# Patient Record
Sex: Female | Born: 1935
Health system: Southern US, Community
[De-identification: ages and names within clinical notes are randomized; demographics above are authoritative.]

## PROBLEM LIST (undated history)

## (undated) DIAGNOSIS — E785 Hyperlipidemia, unspecified: Secondary | ICD-10-CM

## (undated) DIAGNOSIS — I10 Essential (primary) hypertension: Secondary | ICD-10-CM

## (undated) DIAGNOSIS — E079 Disorder of thyroid, unspecified: Secondary | ICD-10-CM

## (undated) HISTORY — PX: OTHER SURGICAL HISTORY: SHX169

---

## 2002-04-09 ENCOUNTER — Encounter: Payer: Self-pay | Admitting: Ophthalmology

## 2002-04-09 ENCOUNTER — Observation Stay (HOSPITAL_COMMUNITY): Admission: RE | Admit: 2002-04-09 | Discharge: 2002-04-10 | Payer: Self-pay | Admitting: Ophthalmology

## 2002-04-22 ENCOUNTER — Ambulatory Visit (HOSPITAL_COMMUNITY): Admission: RE | Admit: 2002-04-22 | Discharge: 2002-04-23 | Payer: Self-pay | Admitting: Ophthalmology

## 2019-04-08 DIAGNOSIS — Z1211 Encounter for screening for malignant neoplasm of colon: Secondary | ICD-10-CM | POA: Diagnosis not present

## 2019-04-08 DIAGNOSIS — Z7189 Other specified counseling: Secondary | ICD-10-CM | POA: Diagnosis not present

## 2019-04-08 DIAGNOSIS — Z1339 Encounter for screening examination for other mental health and behavioral disorders: Secondary | ICD-10-CM | POA: Diagnosis not present

## 2019-04-08 DIAGNOSIS — I10 Essential (primary) hypertension: Secondary | ICD-10-CM | POA: Diagnosis not present

## 2019-04-08 DIAGNOSIS — R5383 Other fatigue: Secondary | ICD-10-CM | POA: Diagnosis not present

## 2019-04-08 DIAGNOSIS — Z299 Encounter for prophylactic measures, unspecified: Secondary | ICD-10-CM | POA: Diagnosis not present

## 2019-04-08 DIAGNOSIS — Z681 Body mass index (BMI) 19 or less, adult: Secondary | ICD-10-CM | POA: Diagnosis not present

## 2019-04-08 DIAGNOSIS — Z1331 Encounter for screening for depression: Secondary | ICD-10-CM | POA: Diagnosis not present

## 2019-04-08 DIAGNOSIS — E559 Vitamin D deficiency, unspecified: Secondary | ICD-10-CM | POA: Diagnosis not present

## 2019-04-08 DIAGNOSIS — E7800 Pure hypercholesterolemia, unspecified: Secondary | ICD-10-CM | POA: Diagnosis not present

## 2019-04-08 DIAGNOSIS — Z0000 Encounter for general adult medical examination without abnormal findings: Secondary | ICD-10-CM | POA: Diagnosis not present

## 2019-04-08 DIAGNOSIS — E039 Hypothyroidism, unspecified: Secondary | ICD-10-CM | POA: Diagnosis not present

## 2019-04-17 ENCOUNTER — Ambulatory Visit: Payer: Self-pay | Attending: Internal Medicine

## 2019-04-17 DIAGNOSIS — Z23 Encounter for immunization: Secondary | ICD-10-CM

## 2019-04-17 NOTE — Progress Notes (Signed)
   Covid-19 Vaccination Clinic  Name:  Jacqueline Yates    MRN: [id number] DOB: [birth year]  04/17/2019  [name] was observed post Covid-19 immunization for 15 minutes without incident. She was provided with Vaccine Information Sheet and instruction to access the V-Safe system.   [name] was instructed to call 911 with any severe reactions post vaccine: Marland Kitchen Difficulty breathing  . Swelling of face and throat  . A fast heartbeat  . A bad rash all over body  . Dizziness and weakness   Immunizations Administered    Name Date Dose VIS Date Route   Moderna COVID-19 Vaccine 04/17/2019 10:18 AM 0.5 mL 12/11/2018 Intramuscular   Manufacturer: Gala Murdoch   Lot: [id number]   NDC: 30123-799-09

## 2019-05-10 DIAGNOSIS — I10 Essential (primary) hypertension: Secondary | ICD-10-CM | POA: Diagnosis not present

## 2019-05-20 ENCOUNTER — Other Ambulatory Visit: Payer: Self-pay

## 2019-05-20 ENCOUNTER — Emergency Department (HOSPITAL_COMMUNITY)
Admission: EM | Admit: 2019-05-20 | Discharge: 2019-05-20 | Disposition: A | Discharge status: Home / Self Care | Payer: Medicare HMO | Attending: Emergency Medicine | Admitting: Emergency Medicine

## 2019-05-20 ENCOUNTER — Encounter (HOSPITAL_COMMUNITY): Payer: Self-pay

## 2019-05-20 DIAGNOSIS — R531 Weakness: Secondary | ICD-10-CM | POA: Diagnosis not present

## 2019-05-20 DIAGNOSIS — R197 Diarrhea, unspecified: Secondary | ICD-10-CM | POA: Diagnosis not present

## 2019-05-20 DIAGNOSIS — R110 Nausea: Secondary | ICD-10-CM | POA: Insufficient documentation

## 2019-05-20 DIAGNOSIS — I10 Essential (primary) hypertension: Secondary | ICD-10-CM | POA: Diagnosis not present

## 2019-05-20 DIAGNOSIS — R42 Dizziness and giddiness: Secondary | ICD-10-CM | POA: Diagnosis not present

## 2019-05-20 DIAGNOSIS — E860 Dehydration: Secondary | ICD-10-CM | POA: Insufficient documentation

## 2019-05-20 HISTORY — DX: Hyperlipidemia, unspecified: E78.5

## 2019-05-20 HISTORY — DX: Disorder of thyroid, unspecified: E07.9

## 2019-05-20 HISTORY — DX: Essential (primary) hypertension: I10

## 2019-05-20 LAB — COMPREHENSIVE METABOLIC PANEL
ALT: 12 U/L (ref 0–44)
AST: 22 U/L (ref 15–41)
Albumin: 3.8 g/dL (ref 3.5–5.0)
Alkaline Phosphatase: 81 U/L (ref 38–126)
Anion gap: 12 (ref 5–15)
BUN: 25 mg/dL — ABNORMAL HIGH (ref 8–23)
CO2: 27 mmol/L (ref 22–32)
Calcium: 9.5 mg/dL (ref 8.9–10.3)
Chloride: 99 mmol/L (ref 98–111)
Creatinine, Ser: 1.09 mg/dL — ABNORMAL HIGH (ref 0.44–1.00)
GFR calc Af Amer: 53 mL/min — ABNORMAL LOW (ref >60)
GFR calc non Af Amer: 46 mL/min — ABNORMAL LOW (ref >60)
Glucose, Bld: 89 mg/dL (ref 70–99)
Potassium: 4.1 mmol/L (ref 3.5–5.1)
Sodium: 138 mmol/L (ref 135–145)
Total Bilirubin: 1.3 mg/dL — ABNORMAL HIGH (ref 0.3–1.2)
Total Protein: 7.1 g/dL (ref 6.5–8.1)

## 2019-05-20 LAB — CBC
HCT: 41.8 % (ref 36.0–46.0)
Hemoglobin: 13.4 g/dL (ref 12.0–15.0)
MCH: 31.2 pg (ref 26.0–34.0)
MCHC: 32.1 g/dL (ref 30.0–36.0)
MCV: 97.2 fL (ref 80.0–100.0)
Platelets: 195 10*3/uL (ref 150–400)
RBC: 4.3 MIL/uL (ref 3.87–5.11)
RDW: 13.5 % (ref 11.5–15.5)
WBC: 4.8 10*3/uL (ref 4.0–10.5)
nRBC: 0 % (ref 0.0–0.2)

## 2019-05-20 LAB — URINALYSIS, ROUTINE W REFLEX MICROSCOPIC
Bacteria, UA: NONE SEEN
Bilirubin Urine: NEGATIVE
Glucose, UA: NEGATIVE mg/dL
Ketones, ur: 5 mg/dL — AB
Leukocytes,Ua: NEGATIVE
Nitrite: NEGATIVE
Protein, ur: NEGATIVE mg/dL
Specific Gravity, Urine: 1.013 (ref 1.005–1.030)
pH: 5 (ref 5.0–8.0)

## 2019-05-20 MED ORDER — SODIUM CHLORIDE 0.9 % IV BOLUS
1000.0000 mL | Freq: Once | INTRAVENOUS | Status: AC
  Administered 2019-05-20: 1000 mL via INTRAVENOUS

## 2019-05-20 NOTE — ED Notes (Signed)
Spoke to [place], informed of pt discharge.

## 2019-05-20 NOTE — ED Provider Notes (Addendum)
[facility] EMERGENCY DEPARTMENT Provider Note   CSN: [id number] Arrival date & time: 05/20/19  0901     History Chief Complaint  Patient presents with  . Nausea    Jacqueline Yates is a 86 y.o. female.  Patient with generalized weakness, nausea, no appetite, poor po intake, lightheaded when stands, and couple episodes nvd in past day. Emesis clear, not blood or bilious. Diarrhea loose to watery, small amount. No abd pain or distension. No known bad food ingestion or ill contacts. No fever or chills. Lightheaded/faint when stands, no syncope. No room spinning or vertigo. No headache. No chest pain or discomfort. No sob. No cough or uri symptoms. No dysuria or gu c/o. No recent change in meds or new meds.   The history is provided by the patient.       Past Medical History:  Diagnosis Date  . Hyperlipemia   . Hypertension   . Thyroid disease     There are no problems to display for this patient.   Past Surgical History:  Procedure Laterality Date  . hip sx  left due to fall 0ct 2020       OB History   No obstetric history on file.     No family history on file.  Social History   Tobacco Use  . Smoking status: Never Smoker  Substance Use Topics  . Alcohol use: Not Currently  . Drug use: Not Currently    Home Medications Prior to Admission medications   Not on File    Allergies    Patient has no known allergies.  Review of Systems   Review of Systems  Constitutional: Negative for chills and fever.  HENT: Negative for sore throat.   Eyes: Negative for redness.  Respiratory: Negative for cough and shortness of breath.   Cardiovascular: Negative for chest pain, palpitations and leg swelling.  Gastrointestinal: Positive for diarrhea, nausea and vomiting. Negative for abdominal pain.  Endocrine: Negative for polyuria.  Genitourinary: Negative for dysuria and flank pain.  Musculoskeletal: Negative for back pain and neck pain.  Skin: Negative for rash.    Neurological: Negative for speech difficulty, numbness and headaches.  Hematological: Does not bruise/bleed easily.  Psychiatric/Behavioral: Negative for confusion.    Physical Exam Updated Vital Signs BP (!) 205/68 (BP Location: Left Arm)   Pulse 79   Temp 97.9 F (36.6 C) (Oral)   Resp 18   Ht 1.626 m (5\' 4" )   Wt 54.4 kg   SpO2 98%   BMI 20.60 kg/m   Physical Exam Vitals and nursing note reviewed.  Constitutional:      Appearance: Normal appearance. She is well-developed.  HENT:     Head: Atraumatic.     Nose: Nose normal.     Mouth/Throat:     Mouth: Mucous membranes are moist.  Eyes:     General: No scleral icterus.    Conjunctiva/sclera: Conjunctivae normal.  Neck:     Vascular: No carotid bruit.     Trachea: No tracheal deviation.     Comments: Thyroid not grossly enlarged or tender. No neck mass or swelling.  Cardiovascular:     Rate and Rhythm: Normal rate and regular rhythm.     Pulses: Normal pulses.     Heart sounds: Normal heart sounds. No murmur. No friction rub. No gallop.   Pulmonary:     Effort: Pulmonary effort is normal. No respiratory distress.     Breath sounds: Normal breath sounds.  Abdominal:  General: Bowel sounds are normal. There is no distension.     Palpations: Abdomen is soft.     Tenderness: There is no abdominal tenderness. There is no guarding.  Genitourinary:    Comments: No cva tenderness.  Musculoskeletal:        General: No swelling or tenderness.     Cervical back: Normal range of motion and neck supple. No rigidity. No muscular tenderness.  Skin:    General: Skin is warm and dry.     Findings: No rash.  Neurological:     Mental Status: She is alert.     Comments: Alert, speech normal/fluent. Motor intact bil, stre 5/5. sens grossly intact bil.   Psychiatric:        Mood and Affect: Mood normal.     ED Results / Procedures / Treatments   Labs (all labs ordered are listed, but only abnormal results are  displayed) Results for orders placed or performed during the hospital encounter of 05/20/19  CBC  Result Value Ref Range   WBC 4.8 4.0 - 10.5 K/uL   RBC 4.30 3.87 - 5.11 MIL/uL   Hemoglobin 13.4 12.0 - 15.0 g/dL   HCT 41.8 36.0 - 46.0 %   MCV 97.2 80.0 - 100.0 fL   MCH 31.2 26.0 - 34.0 pg   MCHC 32.1 30.0 - 36.0 g/dL   RDW 13.5 11.5 - 15.5 %   Platelets 195 150 - 400 K/uL   nRBC 0.0 0.0 - 0.2 %  Comprehensive metabolic panel  Result Value Ref Range   Sodium 138 135 - 145 mmol/L   Potassium 4.1 3.5 - 5.1 mmol/L   Chloride 99 98 - 111 mmol/L   CO2 27 22 - 32 mmol/L   Glucose, Bld 89 70 - 99 mg/dL   BUN 25 (H) 8 - 23 mg/dL   Creatinine, Ser 1.09 (H) 0.44 - 1.00 mg/dL   Calcium 9.5 8.9 - 10.3 mg/dL   Total Protein 7.1 6.5 - 8.1 g/dL   Albumin 3.8 3.5 - 5.0 g/dL   AST 22 15 - 41 U/L   ALT 12 0 - 44 U/L   Alkaline Phosphatase 81 38 - 126 U/L   Total Bilirubin 1.3 (H) 0.3 - 1.2 mg/dL   GFR calc non Af Amer 46 (L) >60 mL/min   GFR calc Af Amer 53 (L) >60 mL/min   Anion gap 12 5 - 15  Urinalysis, Routine w reflex microscopic  Result Value Ref Range   Color, Urine YELLOW YELLOW   APPearance CLEAR CLEAR   Specific Gravity, Urine 1.013 1.005 - 1.030   pH 5.0 5.0 - 8.0   Glucose, UA NEGATIVE NEGATIVE mg/dL   Hgb urine dipstick MODERATE (A) NEGATIVE   Bilirubin Urine NEGATIVE NEGATIVE   Ketones, ur 5 (A) NEGATIVE mg/dL   Protein, ur NEGATIVE NEGATIVE mg/dL   Nitrite NEGATIVE NEGATIVE   Leukocytes,Ua NEGATIVE NEGATIVE   RBC / HPF 0-5 0 - 5 RBC/hpf   WBC, UA 0-5 0 - 5 WBC/hpf   Bacteria, UA NONE SEEN NONE SEEN   Squamous Epithelial / LPF 0-5 0 - 5    EKG None  Radiology No results found.  Procedures Procedures (including critical care time)  Medications Ordered in ED Medications  sodium chloride 0.9 % bolus 1,000 mL (has no administration in time range)    ED Course  I have reviewed the triage vital signs and the nursing notes.  Pertinent labs & imaging results  that were available  during my care of the patient were reviewed by me and considered in my medical decision making (see chart for details).    MDM Rules/Calculators/A&P                      Iv ns bolus. Labs. Ecg.   Reviewed nursing notes and prior charts for additional history.  Initial labs reviewed/interpreted by me - chem normal. Bun is sl elev and trace ketone on UA - ?dehydration. hgb normal.   Po fluids, food.   Additional labs reviewed/interpreted by me - ua neg for infection.   Recheck pt, alert, content, no recurrent nv. No faintness. Tolerating po. Ambulated in hall.   Pt feels improved. No pain. No sob. abd soft nt. Tolerating po. No faintness or dizziness.   Pt currently appears stable for d/c.   Rec pcp f/u.  Return precautions provided.      Final Clinical Impression(s) / ED Diagnoses Final diagnoses:  None    Rx / DC Orders ED Discharge Orders    None         Cathren Laine, MD 05/20/19 [phone]

## 2019-05-20 NOTE — ED Notes (Signed)
Pt ambulated in hall via NT, tolerated well  Gait steady.

## 2019-05-20 NOTE — ED Triage Notes (Signed)
Pt reports nausea since yesterday morning. Diarrhea yesterday with only 2 loose stools. Last diarrhea last night. No vomiting. Denies pain. Has not taken BP med yesterday or today

## 2019-05-20 NOTE — ED Notes (Addendum)
Given fluids for po challenge.   

## 2019-05-20 NOTE — ED Notes (Signed)
Spoke to [employer] at [phone].  Family is asking if pt is going to be admitted.  Informed that the EDP or nurse will call if pt is admitted.

## 2019-05-20 NOTE — Discharge Instructions (Addendum)
It was our pleasure to provide your ER care today - we hope that you feel better.  Rest. Drink plenty of fluids. Eat balanced diet. Consider supplementing nutrition with Boost, Ensure, or other nutritious shake.   Follow up with your doctor in the next 1-2 days for recheck if symptoms fail to improve/resolve. Also follow up with your doctor for recheck of blood pressure, as it is high today.   Return to ER if worse, new symptoms, fevers, new or severe pain, abdominal pain, chest pain, trouble breathing, persistent vomiting, weak/fainting, or other concern.

## 2019-05-20 NOTE — ED Notes (Signed)
Pt aware urine sample is needed 

## 2019-05-20 NOTE — ED Notes (Signed)
Pt ambulates well with assistance

## 2019-05-20 NOTE — ED Notes (Signed)
Pt is aware we need urine sample.  

## 2019-05-20 NOTE — ED Notes (Signed)
Spoke with Delray Alt (daughter).  Will inform of MD plans once orders placed.

## 2019-05-20 NOTE — ED Notes (Signed)
Drank water and soda, tolerated well.  No c/o nausea at this time.

## 2019-05-21 ENCOUNTER — Ambulatory Visit: Payer: Self-pay

## 2019-05-21 ENCOUNTER — Ambulatory Visit: Payer: Medicare HMO | Attending: Internal Medicine

## 2019-05-21 DIAGNOSIS — Z23 Encounter for immunization: Secondary | ICD-10-CM

## 2019-05-21 NOTE — Progress Notes (Signed)
   Covid-19 Vaccination Clinic  Name:  Jacqueline Yates    MRN: [id number] DOB: 1932-01-03  05/21/2019  Ms. Romberger was observed post Covid-19 immunization for 15 minutes without incident. She was provided with Vaccine Information Sheet and instruction to access the V-Safe system.   Ms. Embry was instructed to call 911 with any severe reactions post vaccine: Marland Kitchen Difficulty breathing  . Swelling of face and throat  . A fast heartbeat  . A bad rash all over body  . Dizziness and weakness   Immunizations Administered    Name Date Dose VIS Date Route   Moderna COVID-19 Vaccine 05/21/2019 10:08 AM 0.5 mL 12/2018 Intramuscular   Manufacturer: Moderna   Lot: [id number]   NDC: 37342-876-81

## 2019-06-09 DIAGNOSIS — I10 Essential (primary) hypertension: Secondary | ICD-10-CM | POA: Diagnosis not present

## 2019-06-12 DIAGNOSIS — E538 Deficiency of other specified B group vitamins: Secondary | ICD-10-CM | POA: Diagnosis not present

## 2019-06-12 DIAGNOSIS — I10 Essential (primary) hypertension: Secondary | ICD-10-CM | POA: Diagnosis not present

## 2019-06-12 DIAGNOSIS — I25119 Atherosclerotic heart disease of native coronary artery with unspecified angina pectoris: Secondary | ICD-10-CM | POA: Diagnosis not present

## 2019-06-12 DIAGNOSIS — I739 Peripheral vascular disease, unspecified: Secondary | ICD-10-CM | POA: Diagnosis not present

## 2019-06-12 DIAGNOSIS — Z299 Encounter for prophylactic measures, unspecified: Secondary | ICD-10-CM | POA: Diagnosis not present

## 2019-07-10 DIAGNOSIS — I10 Essential (primary) hypertension: Secondary | ICD-10-CM | POA: Diagnosis not present

## 2019-08-09 DIAGNOSIS — I10 Essential (primary) hypertension: Secondary | ICD-10-CM | POA: Diagnosis not present

## 2019-09-10 DIAGNOSIS — I10 Essential (primary) hypertension: Secondary | ICD-10-CM | POA: Diagnosis not present

## 2019-09-12 DIAGNOSIS — N183 Chronic kidney disease, stage 3 unspecified: Secondary | ICD-10-CM | POA: Diagnosis not present

## 2019-09-12 DIAGNOSIS — Z681 Body mass index (BMI) 19 or less, adult: Secondary | ICD-10-CM | POA: Diagnosis not present

## 2019-09-12 DIAGNOSIS — I739 Peripheral vascular disease, unspecified: Secondary | ICD-10-CM | POA: Diagnosis not present

## 2019-09-12 DIAGNOSIS — E039 Hypothyroidism, unspecified: Secondary | ICD-10-CM | POA: Diagnosis not present

## 2019-09-12 DIAGNOSIS — I10 Essential (primary) hypertension: Secondary | ICD-10-CM | POA: Diagnosis not present

## 2019-09-12 DIAGNOSIS — Z299 Encounter for prophylactic measures, unspecified: Secondary | ICD-10-CM | POA: Diagnosis not present

## 2019-09-12 DIAGNOSIS — E7800 Pure hypercholesterolemia, unspecified: Secondary | ICD-10-CM | POA: Diagnosis not present

## 2019-09-12 DIAGNOSIS — I25119 Atherosclerotic heart disease of native coronary artery with unspecified angina pectoris: Secondary | ICD-10-CM | POA: Diagnosis not present

## 2019-09-21 ENCOUNTER — Emergency Department (HOSPITAL_COMMUNITY): Payer: Medicare HMO

## 2019-09-21 ENCOUNTER — Encounter (HOSPITAL_COMMUNITY): Payer: Self-pay

## 2019-09-21 ENCOUNTER — Other Ambulatory Visit: Payer: Self-pay

## 2019-09-21 ENCOUNTER — Emergency Department (HOSPITAL_COMMUNITY)
Admission: EM | Admit: 2019-09-21 | Discharge: 2019-09-21 | Disposition: A | Discharge status: Home / Self Care | Payer: Medicare HMO | Attending: Emergency Medicine | Admitting: Emergency Medicine

## 2019-09-21 DIAGNOSIS — Y999 Unspecified external cause status: Secondary | ICD-10-CM | POA: Diagnosis not present

## 2019-09-21 DIAGNOSIS — Y939 Activity, unspecified: Secondary | ICD-10-CM | POA: Insufficient documentation

## 2019-09-21 DIAGNOSIS — W19XXXA Unspecified fall, initial encounter: Secondary | ICD-10-CM | POA: Diagnosis not present

## 2019-09-21 DIAGNOSIS — I10 Essential (primary) hypertension: Secondary | ICD-10-CM | POA: Insufficient documentation

## 2019-09-21 DIAGNOSIS — S42302A Unspecified fracture of shaft of humerus, left arm, initial encounter for closed fracture: Secondary | ICD-10-CM | POA: Diagnosis not present

## 2019-09-21 DIAGNOSIS — R609 Edema, unspecified: Secondary | ICD-10-CM | POA: Diagnosis not present

## 2019-09-21 DIAGNOSIS — S42202A Unspecified fracture of upper end of left humerus, initial encounter for closed fracture: Secondary | ICD-10-CM

## 2019-09-21 DIAGNOSIS — S42292A Other displaced fracture of upper end of left humerus, initial encounter for closed fracture: Secondary | ICD-10-CM | POA: Diagnosis not present

## 2019-09-21 DIAGNOSIS — Y92090 Kitchen in other non-institutional residence as the place of occurrence of the external cause: Secondary | ICD-10-CM | POA: Diagnosis not present

## 2019-09-21 DIAGNOSIS — M25512 Pain in left shoulder: Secondary | ICD-10-CM | POA: Insufficient documentation

## 2019-09-21 DIAGNOSIS — R030 Elevated blood-pressure reading, without diagnosis of hypertension: Secondary | ICD-10-CM

## 2019-09-21 DIAGNOSIS — Z79899 Other long term (current) drug therapy: Secondary | ICD-10-CM | POA: Diagnosis not present

## 2019-09-21 DIAGNOSIS — M25519 Pain in unspecified shoulder: Secondary | ICD-10-CM | POA: Diagnosis not present

## 2019-09-21 DIAGNOSIS — W010XXA Fall on same level from slipping, tripping and stumbling without subsequent striking against object, initial encounter: Secondary | ICD-10-CM

## 2019-09-21 DIAGNOSIS — R52 Pain, unspecified: Secondary | ICD-10-CM | POA: Diagnosis not present

## 2019-09-21 DIAGNOSIS — R0902 Hypoxemia: Secondary | ICD-10-CM | POA: Diagnosis not present

## 2019-09-21 MED ORDER — TRAMADOL HCL 50 MG PO TABS
50.0000 mg | ORAL_TABLET | Freq: Once | ORAL | Status: AC
  Administered 2019-09-21: 50 mg via ORAL
  Filled 2019-09-21: qty 1

## 2019-09-21 MED ORDER — TRAMADOL HCL 50 MG PO TABS: 50.0000 mg | ORAL_TABLET | Freq: Three times a day (TID) | ORAL | 0 refills | Status: DC | PRN

## 2019-09-21 MED ORDER — ACETAMINOPHEN 325 MG PO TABS
650.0000 mg | ORAL_TABLET | Freq: Once | ORAL | Status: AC
  Administered 2019-09-21: 650 mg via ORAL
  Filled 2019-09-21: qty 2

## 2019-09-21 NOTE — ED Provider Notes (Addendum)
[facility] EMERGENCY DEPARTMENT Provider Note   CSN: [id number] Arrival date & time: 09/21/19  6203     History Chief Complaint  Patient presents with  . Fall    Left shoulder    Jacqueline Yates is a 86 y.o. female.  Patient presents via EMS s/p trip and fall last pm. States was in kitchen, did not have walker w her, and tripped/fell onto left shoulder. C/o acute onset left shoulder pain, constant, dull, moderate, non radiating, worse w movement of shoulder. Denies loc. No headache. No anticoag use. Denies neck or back pain. No hip or leg pain. Pt denies other pain or injury. Skin intact. No arm numbness/weakness. Otherwise does not feel sick or ill. Denies other recent fall. Pt is right hand dominant.   The history is provided by the patient and the EMS personnel.  Fall Pertinent negatives include no chest pain, no abdominal pain, no headaches and no shortness of breath.       Past Medical History:  Diagnosis Date  . Hyperlipemia   . Hypertension   . Thyroid disease     There are no problems to display for this patient.   Past Surgical History:  Procedure Laterality Date  . hip sx  left due to fall 0ct 2020       OB History   No obstetric history on file.     No family history on file.  Social History   Tobacco Use  . Smoking status: Never Smoker  . Smokeless tobacco: Never Used  Substance Use Topics  . Alcohol use: Not Currently  . Drug use: Not Currently    Home Medications Prior to Admission medications   Medication Sig Start Date End Date Taking? Authorizing Provider  atenolol (TENORMIN) 25 MG tablet Take 12.5 mg by mouth daily.  05/10/19   [provider]  levothyroxine (SYNTHROID) 25 MCG tablet Take 25 mcg by mouth daily before breakfast.    [provider]  simvastatin (ZOCOR) 20 MG tablet Take 20 mg by mouth at bedtime. 05/06/19   [provider]  VITAMIN D PO Take 1 tablet by mouth daily at 6 (six) AM.    [provider]    Allergies    Patient has no known allergies.  Review of Systems   Review of Systems  Constitutional: Negative for fever.  HENT: Negative for nosebleeds.   Eyes: Negative for redness.  Respiratory: Negative for shortness of breath.   Cardiovascular: Negative for chest pain.  Gastrointestinal: Negative for abdominal pain, nausea and vomiting.  Genitourinary: Negative for flank pain.  Musculoskeletal: Negative for back pain and neck pain.  Skin: Negative for wound.  Neurological: Negative for weakness, numbness and headaches.  Hematological: Does not bruise/bleed easily.  Psychiatric/Behavioral: Negative for confusion.    Physical Exam Updated Vital Signs BP (!) 206/80   Pulse 70   Temp 98.1 F (36.7 C) (Oral)   Resp 18   Ht 1.626 m (5\' 4" )   Wt 55 kg   SpO2 95%   BMI 20.81 kg/m   Physical Exam Vitals and nursing note reviewed.  Constitutional:      Appearance: Normal appearance. She is well-developed.  HENT:     Head: Atraumatic.     Nose: Nose normal.     Mouth/Throat:     Mouth: Mucous membranes are moist.  Eyes:     General: No scleral icterus.    Conjunctiva/sclera: Conjunctivae normal.     Pupils: Pupils are  equal, round, and reactive to light.  Neck:     Trachea: No tracheal deviation.  Cardiovascular:     Rate and Rhythm: Normal rate and regular rhythm.     Pulses: Normal pulses.     Heart sounds: Normal heart sounds. No murmur heard.  No friction rub. No gallop.   Pulmonary:     Effort: Pulmonary effort is normal. No respiratory distress.     Breath sounds: Normal breath sounds.  Chest:     Chest wall: No tenderness.  Abdominal:     General: Bowel sounds are normal. There is no distension.     Palpations: Abdomen is soft.     Tenderness: There is no abdominal tenderness.     Comments: No abd bruising or contusion.   Genitourinary:    Comments: No cva tenderness.  Musculoskeletal:        General: No swelling.     Cervical  back: Normal range of motion and neck supple. No rigidity. No muscular tenderness.     Comments: CTLS spine, non tender, aligned, no step off. Tenderness/pain left shoulder, mild sts. Radial pulse 2+ bil. Otherwise good rom bil extremities without other pain or focal bony tenderness.   Skin:    General: Skin is warm and dry.     Findings: No rash.  Neurological:     Mental Status: She is alert.     Comments: Alert, speech normal. GCS 15. LUE rad/med/uln fxn, motor and sensory intact. Moves bil ext with good strength. Sensation intact.   Psychiatric:        Mood and Affect: Mood normal.     ED Results / Procedures / Treatments   Labs (all labs ordered are listed, but only abnormal results are displayed) Labs Reviewed - No data to display  EKG None  Radiology DG Shoulder Left  Result Date: 09/21/2019 CLINICAL DATA:  Fall.  Shoulder pain EXAM: LEFT SHOULDER - 2+ VIEW COMPARISON:  None. FINDINGS: Severe impaction type fracture of the proximal LEFT humerus. The glenohumeral head is slightly subluxed in the joint. Avulsion of the greater tuberosity. IMPRESSION: Complex impaction fracture of the proximal LEFT humerus. Humeral head is subluxed inferiorly in the joint. Electronically Signed   By: Genevive Bi M.D.   On: 09/21/2019 07:27    Procedures Procedures (including critical care time)  Medications Ordered in ED Medications  acetaminophen (TYLENOL) tablet 650 mg (has no administration in time range)  traMADol (ULTRAM) tablet 50 mg (has no administration in time range)    ED Course  I have reviewed the triage vital signs and the nursing notes.  Pertinent labs & imaging results that were available during my care of the patient were reviewed by me and considered in my medical decision making (see chart for details).    MDM Rules/Calculators/A&P                          Xrays. Icepack to sore area.   Reviewed nursing notes and prior charts for additional history.    Xrays reviewed/interpreted by me - proximal humerus fx. Discussed xrays w pt.   Shoulder sling/immobilizer.   No meds pta. Acetaminophen po. Ultram po.  Pt indicates lives at home with daughter. Will also make home health referral.   Rec ortho f/u.  Return precautions provided.    Final Clinical Impression(s) / ED Diagnoses Final diagnoses:  None    Rx / DC Orders ED Discharge Orders  None        Cathren Laine, MD 09/21/19 [phone]

## 2019-09-21 NOTE — Discharge Instructions (Addendum)
It was our pleasure to provide your ER care today - we hope that you feel better.  Wear shoulder sling for comfort/support. Icepack/cold to sore area. Take acetaminophen as need for pain. You may also take ultram as need for pain. Fall precautions - use great care to avoid falls, consider having family get you a wheelchair to assist with getting around while your arm heals.   Follow up with orthopedist in the coming week - call office Monday AM to arrange appointment.   For blood pressure is high - continue your medication, limit salt intake, and follow up with primary care doctor in the next couple weeks.   We have made a home health referral - they should be contacting you in the next couple days.   Return to ER if worse, new symptoms, fevers, weak/fainting, new or severe pain, severe headache, numbness/weakness, or other concern.

## 2019-09-21 NOTE — ED Triage Notes (Addendum)
Pt brought in By EMS for a fall that occurred yesterday. Pt c/o left shoulder pain. Pt denies LOC. No other complaints at this time.

## 2019-09-26 DIAGNOSIS — Z299 Encounter for prophylactic measures, unspecified: Secondary | ICD-10-CM | POA: Diagnosis not present

## 2019-09-26 DIAGNOSIS — I739 Peripheral vascular disease, unspecified: Secondary | ICD-10-CM | POA: Diagnosis not present

## 2019-09-26 DIAGNOSIS — Z681 Body mass index (BMI) 19 or less, adult: Secondary | ICD-10-CM | POA: Diagnosis not present

## 2019-09-26 DIAGNOSIS — I10 Essential (primary) hypertension: Secondary | ICD-10-CM | POA: Diagnosis not present

## 2019-09-26 DIAGNOSIS — N183 Chronic kidney disease, stage 3 unspecified: Secondary | ICD-10-CM | POA: Diagnosis not present

## 2019-09-26 DIAGNOSIS — I25119 Atherosclerotic heart disease of native coronary artery with unspecified angina pectoris: Secondary | ICD-10-CM | POA: Diagnosis not present

## 2019-10-03 ENCOUNTER — Encounter: Payer: Self-pay | Admitting: Orthopaedic Surgery

## 2019-10-03 ENCOUNTER — Ambulatory Visit (INDEPENDENT_AMBULATORY_CARE_PROVIDER_SITE_OTHER): Payer: Medicare HMO | Admitting: Orthopaedic Surgery

## 2019-10-03 DIAGNOSIS — S42202A Unspecified fracture of upper end of left humerus, initial encounter for closed fracture: Secondary | ICD-10-CM | POA: Diagnosis not present

## 2019-10-03 NOTE — Progress Notes (Signed)
Office Visit Note   Patient: Jacqueline Yates           Date of Birth: [birth year]           MRN: [id number] Visit Date: 10/03/2019              Requested by: Medicine, [facility] [address] [place],  [place] [address] PCP: Medicine, [facility]   Assessment & Plan: Visit Diagnoses:  1. Closed fracture of proximal end of left humerus, unspecified fracture morphology, initial encounter     Plan: Three-part fracture with some inferior subluxation we discussed with her conservative treatment is initially recommended. She understands she might require hemiarthroplasty at a later point. She states pain is significantly decreased since the date of her fall. I'll check her back in 4 weeks with repeat x-ray she can work on gently moving her shoulder some with the sling on.  Follow-Up Instructions: Return in about 4 years (around 10/03/2023).   Orders:  No orders of the defined types were placed in this encounter.  No orders of the defined types were placed in this encounter.     Procedures: No procedures performed   Clinical Data: No additional findings.   Subjective: Chief Complaint  Patient presents with  . Left Shoulder - Fracture    Fall 09/20/2019    HPI 86 year old female fell 09/20/2019 and injuring her left nondominant shoulder. She had a comminuted fracture proximal humerus with some inferior subluxation of the head. She is in a shoulder immobilizer. States she was washing dishes and was going to the refrigerator when she fell. Patient is blind and right eye she had glaucoma and eyelids closed. She ambulates with a rolling walker just using the right hand only. She did have a fall in October broke her left hip and leg that surgery was done in [place]. Additional problems with hypertension osteoporosis and thyroid condition.  Review of Systems all other systems are noncontributory. She had multiple right eye surgeries.   Objective: Vital Signs: Ht 4\' 11"  (1.499 m)    Wt 96 lb (43.5 kg)   BMI 19.39 kg/m   Physical Exam Constitutional:      Appearance: She is well-developed.  HENT:     Head: Normocephalic.     Right Ear: External ear normal.     Left Ear: External ear normal.  Eyes:     Comments: Right eye closed she only cc items. Left eye reacts to light .   Neck:     Thyroid: No thyromegaly.     Trachea: No tracheal deviation.  Cardiovascular:     Rate and Rhythm: Normal rate.  Pulmonary:     Effort: Pulmonary effort is normal.  Abdominal:     Palpations: Abdomen is soft.  Skin:    General: Skin is warm and dry.  Neurological:     Mental Status: She is alert and oriented to person, place, and time.  Psychiatric:        Behavior: Behavior normal.     Ortho Exam patient's in a shoulder immobilizer. Strep runway spurs removed. She has deltoid intact sensation. Minimal swelling of the fingertips she can flex and extend her fingers.  Specialty Comments:  No specialty comments available.  Imaging: CLINICAL DATA:  Fall.  Shoulder pain  EXAM: LEFT SHOULDER - 2+ VIEW  COMPARISON:  None.  FINDINGS: Severe impaction type fracture of the proximal LEFT humerus. The glenohumeral head is slightly subluxed in the joint. Avulsion of the  greater tuberosity.  IMPRESSION: Complex impaction fracture of the proximal LEFT humerus.  Humeral head is subluxed inferiorly in the joint.   Electronically Signed   By: Genevive Bi M.D.   On: 09/21/2019 07:27    PMFS History: Patient Active Problem List   Diagnosis Date Noted  . Closed fracture of left proximal humerus 10/03/2019   Past Medical History:  Diagnosis Date  . Hyperlipemia   . Hypertension   . Thyroid disease     No family history on file.  Past Surgical History:  Procedure Laterality Date  . hip sx  left due to fall 0ct 2020     Social History   Occupational History  . Not on file  Tobacco Use  . Smoking status: Never Smoker  . Smokeless tobacco:  Never Used  Substance and Sexual Activity  . Alcohol use: Not Currently  . Drug use: Not Currently  . Sexual activity: Not on file

## 2019-10-10 DIAGNOSIS — I10 Essential (primary) hypertension: Secondary | ICD-10-CM | POA: Diagnosis not present

## 2019-11-07 ENCOUNTER — Ambulatory Visit (INDEPENDENT_AMBULATORY_CARE_PROVIDER_SITE_OTHER): Payer: Medicare HMO

## 2019-11-07 ENCOUNTER — Ambulatory Visit (INDEPENDENT_AMBULATORY_CARE_PROVIDER_SITE_OTHER): Payer: Medicare HMO | Admitting: Orthopaedic Surgery

## 2019-11-07 ENCOUNTER — Encounter: Payer: Self-pay | Admitting: Orthopaedic Surgery

## 2019-11-07 ENCOUNTER — Other Ambulatory Visit: Payer: Self-pay

## 2019-11-07 VITALS — Ht 59.0 in | Wt 96.0 lb

## 2019-11-07 DIAGNOSIS — S42202A Unspecified fracture of upper end of left humerus, initial encounter for closed fracture: Secondary | ICD-10-CM

## 2019-11-07 NOTE — Progress Notes (Signed)
Office Visit Note   Patient: Jacqueline Yates           Date of Birth: 04-01-1931           MRN: [id number] Visit Date: 11/07/2019              Requested by: Medicine, [facility] [address] [place],  [place] [address] PCP: Medicine, [facility]   Assessment & Plan: Visit Diagnoses:  1. Closed fracture of proximal end of left humerus, unspecified fracture morphology, initial encounter     Plan: Proximal humerus fracture healed with conservative treatment.  We will release her from care she can follow-up as needed.  X-ray results reviewed with patient.  Discontinue sling.  Follow-Up Instructions: No follow-ups on file.   Orders:  Orders Placed This Encounter  Procedures  . XR Shoulder Left   No orders of the defined types were placed in this encounter.     Procedures: No procedures performed   Clinical Data: No additional findings.   Subjective: Chief Complaint  Patient presents with  . Left Upper Arm - Fracture, Follow-up    Fall 09/21/2019    HPI 86 year old female returns post fall with proximal left humerus fracture she is wearing her sling up around her upper arm not at the elbow.  She is able to reach top of her head and across her opposite shoulder states she has no pain.  Sensation hand is intact.  Review of Systems updated unchanged from last visit.   Objective: Vital Signs: Ht 4\' 11"  (1.499 m)   Wt 96 lb (43.5 kg)   BMI 19.39 kg/m   Physical Exam Constitutional:      Appearance: She is well-developed.  HENT:     Head: Normocephalic.     Right Ear: External ear normal.     Left Ear: External ear normal.  Eyes:     Pupils: Pupils are equal, round, and reactive to light.  Neck:     Thyroid: No thyromegaly.     Trachea: No tracheal deviation.  Cardiovascular:     Rate and Rhythm: Normal rate.  Pulmonary:     Effort: Pulmonary effort is normal.  Abdominal:     Palpations: Abdomen is soft.  Skin:    General: Skin is warm and dry.    Neurological:     Mental Status: She is alert and oriented to person, place, and time.  Psychiatric:        Behavior: Behavior normal.     Ortho Exam patient is using a rolling walker she has no pain in proximal and distal portions of the humerus move in 1 piece.  She can reach the top of her head across to her opposite shoulder she is limited in hand to just posterior axillary line but states she is not having problems with activities of daily living.  Specialty Comments:  No specialty comments available.  Imaging: XR Shoulder Left  Result Date: 11/07/2019 Three-view x-rays left proximal humerus obtained and reviewed.  This shows healed surgical neck fracture with some angulation and shortening.  Humeral head is against the glenoid. Impression: Healed proximal humerus fracture without subluxation or dislocation of the left  shoulder.    PMFS History: Patient Active Problem List   Diagnosis Date Noted  . Closed fracture of left proximal humerus 10/03/2019   Past Medical History:  Diagnosis Date  . Hyperlipemia   . Hypertension   . Thyroid disease     No family history on  file.  Past Surgical History:  Procedure Laterality Date  . hip sx  left due to fall 0ct 2020     Social History   Occupational History  . Not on file  Tobacco Use  . Smoking status: Never Smoker  . Smokeless tobacco: Never Used  Substance and Sexual Activity  . Alcohol use: Not Currently  . Drug use: Not Currently  . Sexual activity: Not on file

## 2019-11-09 DIAGNOSIS — I10 Essential (primary) hypertension: Secondary | ICD-10-CM | POA: Diagnosis not present

## 2019-12-10 DIAGNOSIS — I10 Essential (primary) hypertension: Secondary | ICD-10-CM | POA: Diagnosis not present

## 2019-12-12 DIAGNOSIS — I10 Essential (primary) hypertension: Secondary | ICD-10-CM | POA: Diagnosis not present

## 2019-12-12 DIAGNOSIS — I25119 Atherosclerotic heart disease of native coronary artery with unspecified angina pectoris: Secondary | ICD-10-CM | POA: Diagnosis not present

## 2019-12-12 DIAGNOSIS — Z23 Encounter for immunization: Secondary | ICD-10-CM | POA: Diagnosis not present

## 2019-12-12 DIAGNOSIS — E7800 Pure hypercholesterolemia, unspecified: Secondary | ICD-10-CM | POA: Diagnosis not present

## 2019-12-12 DIAGNOSIS — Z299 Encounter for prophylactic measures, unspecified: Secondary | ICD-10-CM | POA: Diagnosis not present

## 2019-12-12 DIAGNOSIS — I739 Peripheral vascular disease, unspecified: Secondary | ICD-10-CM | POA: Diagnosis not present

## 2020-01-09 DIAGNOSIS — I10 Essential (primary) hypertension: Secondary | ICD-10-CM | POA: Diagnosis not present

## 2020-02-10 DIAGNOSIS — I10 Essential (primary) hypertension: Secondary | ICD-10-CM | POA: Diagnosis not present

## 2020-03-09 DIAGNOSIS — I10 Essential (primary) hypertension: Secondary | ICD-10-CM | POA: Diagnosis not present

## 2020-03-11 DIAGNOSIS — I739 Peripheral vascular disease, unspecified: Secondary | ICD-10-CM | POA: Diagnosis not present

## 2020-03-11 DIAGNOSIS — I10 Essential (primary) hypertension: Secondary | ICD-10-CM | POA: Diagnosis not present

## 2020-03-11 DIAGNOSIS — Z299 Encounter for prophylactic measures, unspecified: Secondary | ICD-10-CM | POA: Diagnosis not present

## 2020-03-11 DIAGNOSIS — I2510 Atherosclerotic heart disease of native coronary artery without angina pectoris: Secondary | ICD-10-CM | POA: Diagnosis not present

## 2020-03-11 DIAGNOSIS — N183 Chronic kidney disease, stage 3 unspecified: Secondary | ICD-10-CM | POA: Diagnosis not present

## 2020-03-11 DIAGNOSIS — E039 Hypothyroidism, unspecified: Secondary | ICD-10-CM | POA: Diagnosis not present

## 2020-03-11 DIAGNOSIS — I25119 Atherosclerotic heart disease of native coronary artery with unspecified angina pectoris: Secondary | ICD-10-CM | POA: Diagnosis not present

## 2020-04-08 DIAGNOSIS — Z7189 Other specified counseling: Secondary | ICD-10-CM | POA: Diagnosis not present

## 2020-04-08 DIAGNOSIS — Z1339 Encounter for screening examination for other mental health and behavioral disorders: Secondary | ICD-10-CM | POA: Diagnosis not present

## 2020-04-08 DIAGNOSIS — Z6821 Body mass index (BMI) 21.0-21.9, adult: Secondary | ICD-10-CM | POA: Diagnosis not present

## 2020-04-08 DIAGNOSIS — I10 Essential (primary) hypertension: Secondary | ICD-10-CM | POA: Diagnosis not present

## 2020-04-08 DIAGNOSIS — E7800 Pure hypercholesterolemia, unspecified: Secondary | ICD-10-CM | POA: Diagnosis not present

## 2020-04-08 DIAGNOSIS — Z299 Encounter for prophylactic measures, unspecified: Secondary | ICD-10-CM | POA: Diagnosis not present

## 2020-04-08 DIAGNOSIS — E039 Hypothyroidism, unspecified: Secondary | ICD-10-CM | POA: Diagnosis not present

## 2020-04-08 DIAGNOSIS — Z0000 Encounter for general adult medical examination without abnormal findings: Secondary | ICD-10-CM | POA: Diagnosis not present

## 2020-04-08 DIAGNOSIS — Z1331 Encounter for screening for depression: Secondary | ICD-10-CM | POA: Diagnosis not present

## 2020-04-09 DIAGNOSIS — I10 Essential (primary) hypertension: Secondary | ICD-10-CM | POA: Diagnosis not present

## 2020-05-08 DIAGNOSIS — I10 Essential (primary) hypertension: Secondary | ICD-10-CM | POA: Diagnosis not present

## 2020-06-09 DIAGNOSIS — I10 Essential (primary) hypertension: Secondary | ICD-10-CM | POA: Diagnosis not present

## 2020-07-09 DIAGNOSIS — I10 Essential (primary) hypertension: Secondary | ICD-10-CM | POA: Diagnosis not present

## 2020-08-24 ENCOUNTER — Other Ambulatory Visit (HOSPITAL_COMMUNITY): Payer: Medicare HMO

## 2020-08-24 ENCOUNTER — Emergency Department (HOSPITAL_COMMUNITY): Payer: Medicare HMO

## 2020-08-24 ENCOUNTER — Inpatient Hospital Stay (HOSPITAL_COMMUNITY)
Admission: EM | Admit: 2020-08-24 | Discharge: 2020-08-26 | DRG: 065 | Disposition: A | Discharge status: Home Health | Payer: Medicare HMO | Attending: Neurology | Admitting: Neurology

## 2020-08-24 ENCOUNTER — Inpatient Hospital Stay (HOSPITAL_COMMUNITY): Payer: Medicare HMO

## 2020-08-24 ENCOUNTER — Encounter (HOSPITAL_COMMUNITY): Payer: Self-pay

## 2020-08-24 DIAGNOSIS — I161 Hypertensive emergency: Secondary | ICD-10-CM

## 2020-08-24 DIAGNOSIS — I618 Other nontraumatic intracerebral hemorrhage: Secondary | ICD-10-CM | POA: Diagnosis not present

## 2020-08-24 DIAGNOSIS — I169 Hypertensive crisis, unspecified: Secondary | ICD-10-CM

## 2020-08-24 DIAGNOSIS — I619 Nontraumatic intracerebral hemorrhage, unspecified: Secondary | ICD-10-CM | POA: Diagnosis not present

## 2020-08-24 DIAGNOSIS — Z79899 Other long term (current) drug therapy: Secondary | ICD-10-CM

## 2020-08-24 DIAGNOSIS — I6389 Other cerebral infarction: Secondary | ICD-10-CM | POA: Diagnosis not present

## 2020-08-24 DIAGNOSIS — S01111A Laceration without foreign body of right eyelid and periocular area, initial encounter: Secondary | ICD-10-CM | POA: Diagnosis present

## 2020-08-24 DIAGNOSIS — I10 Essential (primary) hypertension: Secondary | ICD-10-CM | POA: Diagnosis not present

## 2020-08-24 DIAGNOSIS — Y92009 Unspecified place in unspecified non-institutional (private) residence as the place of occurrence of the external cause: Secondary | ICD-10-CM | POA: Diagnosis not present

## 2020-08-24 DIAGNOSIS — M50323 Other cervical disc degeneration at C6-C7 level: Secondary | ICD-10-CM | POA: Diagnosis not present

## 2020-08-24 DIAGNOSIS — R58 Hemorrhage, not elsewhere classified: Secondary | ICD-10-CM | POA: Diagnosis not present

## 2020-08-24 DIAGNOSIS — H5461 Unqualified visual loss, right eye, normal vision left eye: Secondary | ICD-10-CM | POA: Diagnosis present

## 2020-08-24 DIAGNOSIS — Z6820 Body mass index (BMI) 20.0-20.9, adult: Secondary | ICD-10-CM | POA: Diagnosis not present

## 2020-08-24 DIAGNOSIS — W1830XA Fall on same level, unspecified, initial encounter: Secondary | ICD-10-CM | POA: Diagnosis present

## 2020-08-24 DIAGNOSIS — I6200 Nontraumatic subdural hemorrhage, unspecified: Secondary | ICD-10-CM | POA: Diagnosis not present

## 2020-08-24 DIAGNOSIS — S199XXA Unspecified injury of neck, initial encounter: Secondary | ICD-10-CM | POA: Diagnosis not present

## 2020-08-24 DIAGNOSIS — Z9181 History of falling: Secondary | ICD-10-CM

## 2020-08-24 DIAGNOSIS — Z23 Encounter for immunization: Secondary | ICD-10-CM

## 2020-08-24 DIAGNOSIS — S0181XA Laceration without foreign body of other part of head, initial encounter: Secondary | ICD-10-CM

## 2020-08-24 DIAGNOSIS — Z96642 Presence of left artificial hip joint: Secondary | ICD-10-CM | POA: Diagnosis present

## 2020-08-24 DIAGNOSIS — M50322 Other cervical disc degeneration at C5-C6 level: Secondary | ICD-10-CM | POA: Diagnosis not present

## 2020-08-24 DIAGNOSIS — E441 Mild protein-calorie malnutrition: Secondary | ICD-10-CM | POA: Diagnosis present

## 2020-08-24 DIAGNOSIS — E039 Hypothyroidism, unspecified: Secondary | ICD-10-CM | POA: Diagnosis not present

## 2020-08-24 DIAGNOSIS — H409 Unspecified glaucoma: Secondary | ICD-10-CM | POA: Diagnosis present

## 2020-08-24 DIAGNOSIS — M79605 Pain in left leg: Secondary | ICD-10-CM | POA: Diagnosis not present

## 2020-08-24 DIAGNOSIS — I639 Cerebral infarction, unspecified: Secondary | ICD-10-CM | POA: Diagnosis not present

## 2020-08-24 DIAGNOSIS — E785 Hyperlipidemia, unspecified: Secondary | ICD-10-CM | POA: Diagnosis present

## 2020-08-24 DIAGNOSIS — Z20822 Contact with and (suspected) exposure to covid-19: Secondary | ICD-10-CM | POA: Diagnosis present

## 2020-08-24 DIAGNOSIS — M4312 Spondylolisthesis, cervical region: Secondary | ICD-10-CM | POA: Diagnosis not present

## 2020-08-24 DIAGNOSIS — S06340A Traumatic hemorrhage of right cerebrum without loss of consciousness, initial encounter: Secondary | ICD-10-CM | POA: Diagnosis not present

## 2020-08-24 DIAGNOSIS — S32512A Fracture of superior rim of left pubis, initial encounter for closed fracture: Secondary | ICD-10-CM | POA: Diagnosis not present

## 2020-08-24 DIAGNOSIS — Y92512 Supermarket, store or market as the place of occurrence of the external cause: Secondary | ICD-10-CM | POA: Diagnosis not present

## 2020-08-24 DIAGNOSIS — I160 Hypertensive urgency: Secondary | ICD-10-CM | POA: Diagnosis present

## 2020-08-24 DIAGNOSIS — I610 Nontraumatic intracerebral hemorrhage in hemisphere, subcortical: Secondary | ICD-10-CM | POA: Diagnosis not present

## 2020-08-24 DIAGNOSIS — W19XXXA Unspecified fall, initial encounter: Secondary | ICD-10-CM

## 2020-08-24 DIAGNOSIS — E119 Type 2 diabetes mellitus without complications: Secondary | ICD-10-CM | POA: Diagnosis present

## 2020-08-24 DIAGNOSIS — I609 Nontraumatic subarachnoid hemorrhage, unspecified: Secondary | ICD-10-CM | POA: Diagnosis present

## 2020-08-24 DIAGNOSIS — Z043 Encounter for examination and observation following other accident: Secondary | ICD-10-CM | POA: Diagnosis not present

## 2020-08-24 DIAGNOSIS — G319 Degenerative disease of nervous system, unspecified: Secondary | ICD-10-CM | POA: Diagnosis not present

## 2020-08-24 DIAGNOSIS — Z7984 Long term (current) use of oral hypoglycemic drugs: Secondary | ICD-10-CM

## 2020-08-24 DIAGNOSIS — Z9889 Other specified postprocedural states: Secondary | ICD-10-CM | POA: Diagnosis not present

## 2020-08-24 DIAGNOSIS — S0990XA Unspecified injury of head, initial encounter: Secondary | ICD-10-CM | POA: Diagnosis not present

## 2020-08-24 LAB — RESP PANEL BY RT-PCR (FLU A&B, COVID) ARPGX2
Influenza A by PCR: NEGATIVE
Influenza B by PCR: NEGATIVE
SARS Coronavirus 2 by RT PCR: NEGATIVE

## 2020-08-24 LAB — CBC WITH DIFFERENTIAL/PLATELET
Abs Immature Granulocytes: 0.03 10*3/uL (ref 0.00–0.07)
Basophils Absolute: 0.1 10*3/uL (ref 0.0–0.1)
Basophils Relative: 1 %
Eosinophils Absolute: 0.1 10*3/uL (ref 0.0–0.5)
Eosinophils Relative: 1 %
HCT: 41.8 % (ref 36.0–46.0)
Hemoglobin: 13.3 g/dL (ref 12.0–15.0)
Immature Granulocytes: 0 %
Lymphocytes Relative: 19 %
Lymphs Abs: 1.5 10*3/uL (ref 0.7–4.0)
MCH: 30 pg (ref 26.0–34.0)
MCHC: 31.8 g/dL (ref 30.0–36.0)
MCV: 94.4 fL (ref 80.0–100.0)
Monocytes Absolute: 0.6 10*3/uL (ref 0.1–1.0)
Monocytes Relative: 7 %
Neutro Abs: 5.8 10*3/uL (ref 1.7–7.7)
Neutrophils Relative %: 72 %
Platelets: 185 10*3/uL (ref 150–400)
RBC: 4.43 MIL/uL (ref 3.87–5.11)
RDW: 14.1 % (ref 11.5–15.5)
WBC: 8.1 10*3/uL (ref 4.0–10.5)
nRBC: 0 % (ref 0.0–0.2)

## 2020-08-24 LAB — BASIC METABOLIC PANEL
Anion gap: 8 (ref 5–15)
BUN: 20 mg/dL (ref 8–23)
CO2: 26 mmol/L (ref 22–32)
Calcium: 9.3 mg/dL (ref 8.9–10.3)
Chloride: 106 mmol/L (ref 98–111)
Creatinine, Ser: 1.1 mg/dL — ABNORMAL HIGH (ref 0.44–1.00)
GFR, Estimated: 49 mL/min — ABNORMAL LOW (ref >60)
Glucose, Bld: 99 mg/dL (ref 70–99)
Potassium: 3.9 mmol/L (ref 3.5–5.1)
Sodium: 140 mmol/L (ref 135–145)

## 2020-08-24 LAB — PROTIME-INR
INR: 1.1 (ref 0.8–1.2)
Prothrombin Time: 13.8 seconds (ref 11.4–15.2)

## 2020-08-24 LAB — LIPID PANEL
Cholesterol: 196 mg/dL (ref 0–200)
HDL: 61 mg/dL (ref >40)
LDL Cholesterol: 121 mg/dL — ABNORMAL HIGH (ref 0–99)
Total CHOL/HDL Ratio: 3.2 RATIO
Triglycerides: 69 mg/dL (ref <150)
VLDL: 14 mg/dL (ref 0–40)

## 2020-08-24 LAB — HEMOGLOBIN A1C
Hgb A1c MFr Bld: 5.5 % (ref 4.8–5.6)
Mean Plasma Glucose: 111.15 mg/dL

## 2020-08-24 LAB — APTT: aPTT: 26 seconds (ref 24–36)

## 2020-08-24 MED ORDER — PANTOPRAZOLE SODIUM 40 MG IV SOLR
40.0000 mg | Freq: Every day | INTRAVENOUS | Status: DC
  Administered 2020-08-24: 40 mg via INTRAVENOUS
  Filled 2020-08-24: qty 40

## 2020-08-24 MED ORDER — LABETALOL HCL 5 MG/ML IV SOLN: 10.0000 mg | Freq: Once | INTRAVENOUS | Status: DC

## 2020-08-24 MED ORDER — LIDOCAINE-EPINEPHRINE-TETRACAINE (LET) TOPICAL GEL
3.0000 mL | Freq: Once | TOPICAL | Status: AC
  Administered 2020-08-24: 3 mL via TOPICAL
  Filled 2020-08-24: qty 3

## 2020-08-24 MED ORDER — SIMVASTATIN 20 MG PO TABS
20.0000 mg | ORAL_TABLET | Freq: Every day | ORAL | Status: DC
  Administered 2020-08-24 – 2020-08-25 (×2): 20 mg via ORAL
  Filled 2020-08-24 (×2): qty 1

## 2020-08-24 MED ORDER — TETANUS-DIPHTH-ACELL PERTUSSIS 5-2.5-18.5 LF-MCG/0.5 IM SUSY
0.5000 mL | PREFILLED_SYRINGE | Freq: Once | INTRAMUSCULAR | Status: AC
  Administered 2020-08-24: 0.5 mL via INTRAMUSCULAR
  Filled 2020-08-24: qty 0.5

## 2020-08-24 MED ORDER — STROKE: EARLY STAGES OF RECOVERY BOOK
Freq: Once | Status: AC
  Filled 2020-08-24: qty 1

## 2020-08-24 MED ORDER — CLEVIDIPINE BUTYRATE 0.5 MG/ML IV EMUL
0.0000 mg/h | INTRAVENOUS | Status: DC
  Administered 2020-08-24: 1 mg/h via INTRAVENOUS
  Filled 2020-08-24 (×2): qty 50

## 2020-08-24 MED ORDER — ACETAMINOPHEN 160 MG/5ML PO SOLN: 650.0000 mg | ORAL | Status: DC | PRN

## 2020-08-24 MED ORDER — SENNOSIDES-DOCUSATE SODIUM 8.6-50 MG PO TABS
1.0000 | ORAL_TABLET | Freq: Two times a day (BID) | ORAL | Status: DC
  Administered 2020-08-24 – 2020-08-26 (×4): 1 via ORAL
  Filled 2020-08-24 (×4): qty 1

## 2020-08-24 MED ORDER — LEVOTHYROXINE SODIUM 25 MCG PO TABS
25.0000 ug | ORAL_TABLET | Freq: Every day | ORAL | Status: DC
  Administered 2020-08-25 – 2020-08-26 (×2): 25 ug via ORAL
  Filled 2020-08-24 (×2): qty 1

## 2020-08-24 MED ORDER — LIDOCAINE HCL (PF) 1 % IJ SOLN
5.0000 mL | Freq: Once | INTRAMUSCULAR | Status: AC
  Administered 2020-08-24: 5 mL

## 2020-08-24 MED ORDER — ACETAMINOPHEN 325 MG PO TABS: 650.0000 mg | ORAL_TABLET | ORAL | Status: DC | PRN

## 2020-08-24 MED ORDER — CHLORHEXIDINE GLUCONATE CLOTH 2 % EX PADS
6.0000 | MEDICATED_PAD | Freq: Every day | CUTANEOUS | Status: DC
  Administered 2020-08-25: 6 via TOPICAL

## 2020-08-24 MED ORDER — LIDOCAINE HCL (PF) 1 % IJ SOLN
INTRAMUSCULAR | Status: AC
  Filled 2020-08-24: qty 5

## 2020-08-24 MED ORDER — ACETAMINOPHEN 650 MG RE SUPP: 650.0000 mg | RECTAL | Status: DC | PRN

## 2020-08-24 NOTE — ED Notes (Signed)
Per Britni PA, OK to titrate to 1.5mg  of cleviprex. Pt BP dropped to 112 systolic w/ drip going @ 2mg  / hr. Pt titrated back down to 1 mg and pt elevated to 149systolic. Pt now running @ 1.5mg  / hr. Will continue to monitor

## 2020-08-24 NOTE — ED Triage Notes (Signed)
Pt. Arrived via EMS after falling at [employer]. Pt has a laceration above their left eye from where they fell. Pt. Is not on blood thinners. Bleeding is controlled at this time. CBG is 110. Pts. Blood pressure was 210/105 on scene. Pts. Blood pressure on arrival was 208/90.

## 2020-08-24 NOTE — ED Notes (Addendum)
Per Brittni, PA cleviprex goal <140 for systolic bp

## 2020-08-24 NOTE — H&P (Signed)
NEUROLOGY CONSULTATION NOTE   Date of service: August 24, 2020 Patient Name: Jacqueline Yates MRN:  [id number] DOB:  Jan 12, 1931 _ _ _   _ __   _ __ _ _  __ __   _ __   __ _  History of Present Illness  Jacqueline Yates is a 87 y.o. female with PMH significant for HLD, HTN, hypothyroidism, chronic R eye blindness 2/2 glaucoma, prior left humerus fracture and left hip replacement who presents with a fall at [employer] and hit her head with a L forehead laceration. She was initially taken to [facility] ED where she was found to have an acute small R BG ICH. She was noted to have elevated BP. She was started on Cleviprex and transferred to [facility] for further evaluation and workup.  She endorses chronic HTN which has been difficult to manage. Lives the daughter who check her blood pressure frequently and it varies all the time. Sometimes will be high, other times low. Takes her blood pressure meds on time.  No prior hx of strokes, no hx of ICH. Does have a hx of falls. Does not smoke.  mRS:3 tPA/Thrombectomy: No, ICH ICH score: 1 NIHSS components Score: Comment  1a Level of Conscious 0[x]  1[]  2[]  3[]      1b LOC Questions 0[x]  1[]  2[]       1c LOC Commands 0[x]  1[]  2[]       2 Best Gaze 0[x]  1[]  2[]       3 Visual 0[]  1[]  2[x]  3[]    Chronic R eye blindness  4 Facial Palsy 0[x]  1[]  2[]  3[]      5a Motor Arm - left 0[x]  1[]  2[]  3[]  4[]  UN[]    5b Motor Arm - Right 0[x]  1[]  2[]  3[]  4[]  UN[]    6a Motor Leg - Left 0[x]  1[]  2[]  3[]  4[]  UN[]    6b Motor Leg - Right 0[x]  1[]  2[]  3[]  4[]  UN[]    7 Limb Ataxia 0[x]  1[]  2[]  3[]  UN[]     8 Sensory 0[x]  1[]  2[]  UN[]      9 Best Language 0[x]  1[]  2[]  3[]      10 Dysarthria 0[x]  1[]  2[]  UN[]      11 Extinct. and Inattention 0[x]  1[]  2[]       TOTAL: 2     ROS   Constitutional Denies weight loss, fever and chills.   HEENT Chronic R eye vision loss 2/2 glaucoma. Hard of hearing.   Respiratory Denies SOB and cough.   CV Denies palpitations and CP   GI Denies  abdominal pain, nausea, vomiting and diarrhea.   GU Denies dysuria and urinary frequency.   MSK Denies myalgia and joint pain.   Skin Denies rash and pruritus.   Neurological Denies headache and syncope.   Psychiatric Denies recent changes in mood. Denies anxiety and depression.    Past History   Past Medical History:  Diagnosis Date  . Hyperlipemia   . Hypertension   . Thyroid disease    Past Surgical History:  Procedure Laterality Date  . hip sx  left due to fall 0ct 2020     History reviewed. No pertinent family history. Social History   Socioeconomic History  . Marital status: Widowed    Spouse name: Not on file  . Number of children: Not on file  . Years of education: Not on file  . Highest education level: Not on file  Occupational History  . Not on file  Tobacco Use  . Smoking status: Never  . Smokeless  tobacco: Never  Substance and Sexual Activity  . Alcohol use: Not Currently  . Drug use: Not Currently  . Sexual activity: Not on file  Other Topics Concern  . Not on file  Social History Narrative  . Not on file   Social Determinants of Health   Financial Resource Strain: Not on file  Food Insecurity: Not on file  Transportation Needs: Not on file  Physical Activity: Not on file  Stress: Not on file  Social Connections: Not on file   No Known Allergies  Medications   Medications Prior to Admission  Medication Sig Dispense Refill Last Dose  . atenolol (TENORMIN) 25 MG tablet Take 25 mg by mouth daily.   08/23/2020 at 1700  . levothyroxine (SYNTHROID) 25 MCG tablet Take 25 mcg by mouth daily before breakfast.   08/24/2020  . simvastatin (ZOCOR) 20 MG tablet Take 20 mg by mouth at bedtime.   08/23/2020  . traMADol (ULTRAM) 50 MG tablet Take 1 tablet (50 mg total) by mouth every 8 (eight) hours as needed. (Patient not taking: No sig reported) 15 tablet 0 Not Taking  . VITAMIN D PO Take 1 tablet by mouth daily at 6 (six) AM. (Patient not taking: No sig  reported)   Not Taking     Vitals   Vitals:   08/24/20 1715 08/24/20 1730 08/24/20 1800 08/24/20 1815  BP: (!) 123/53 (!) 132/55 (!) 126/50 (!) 130/54  Pulse: 68 73 76 73  Resp: 12 14 17 16   Temp:      TempSrc:      SpO2: 95% 95% 96% 97%  Weight:      Height:         Body mass index is 20 kg/m.  Physical Exam   General: Laying comfortably in bed; in no acute distress.  HENT: Normal oropharynx and mucosa. Normal external appearance of ears and nose.  Neck: Supple, no pain or tenderness  CV: No JVD. No peripheral edema.  Pulmonary: Symmetric Chest rise. Normal respiratory effort.  Abdomen: Soft to touch, non-tender.  Ext: No cyanosis, edema, or deformity  Skin: No rash. Normal palpation of skin.   Musculoskeletal: Normal digits and nails by inspection. No clubbing.   Neurologic Examination  Mental status/Cognition: Alert, oriented to self, place, month and year, good attention.  Speech/language: Fluent, comprehension intact, object naming intact, repetition intact.  Cranial nerves:   CN II Pupils equal and reactive to light, R eye blind.   CN III,IV,VI EOM intact, no gaze preference or deviation, no nystagmus    CN V normal sensation in V1, V2, and V3 segments bilaterally   CN VII no asymmetry, no nasolabial fold flattening   CN VIII normal hearing to speech   CN IX & X normal palatal elevation, no uvular deviation   CN XI 5/5 head turn and 5/5 shoulder shrug bilaterally   CN XII midline tongue protrusion   Motor:  Muscle bulk: poor with thin extremities, tone normal, pronator drift none tremor none Mvmt Root Nerve  Muscle Right Left Comments  SA C5/6 Ax Deltoid 4 3 Has chronic L humerus fx.  EF C5/6 Mc Biceps 4 4   EE C6/7/8 Rad Triceps 4 4   WF C6/7 Med FCR     WE C7/8 PIN ECU     F Ab C8/T1 U ADM/FDI 4+ 4+   HF L1/2/3 Fem Illopsoas     KE L2/3/4 Fem Quad     DF L4/5 D Peron Tib Ant  PF S1/2 Tibial Grc/Sol      Reflexes:  Right Left Comments  Pectoralis       Biceps (C5/6) 2 2   Brachioradialis (C5/6) 2 2    Triceps (C6/7) 2 2    Patellar (L3/4) 2 2    Achilles (S1)      Hoffman      Plantar     Jaw jerk    Sensation:  Light touch intact   Pin prick    Temperature    Vibration   Proprioception    Coordination/Complex Motor:  - Finger to Nose intat  - Heel to shin intact BL - Rapid alternating movement are slowed throughout - Gait: unsafe given her ICH and frequent falls.  Labs   CBC:  Recent Labs  Lab 08/24/20 1507  WBC 8.1  NEUTROABS 5.8  HGB 13.3  HCT 41.8  MCV 94.4  PLT 185    Basic Metabolic Panel:  Lab Results  Component Value Date   NA 140 08/24/2020   K 3.9 08/24/2020   CO2 26 08/24/2020   GLUCOSE 99 08/24/2020   BUN 20 08/24/2020   CREATININE 1.10 (H) 08/24/2020   CALCIUM 9.3 08/24/2020   GFRNONAA 49 (L) 08/24/2020   GFRAA 53 (L) 05/20/2019   Lipid Panel: No results found for: LDLCALC HgbA1c: No results found for: HGBA1C Urine Drug Screen: No results found for: LABOPIA, COCAINSCRNUR, LABBENZ, AMPHETMU, THCU, LABBARB  Alcohol Level No results found for: [facility]  CT Head without contrast: Personally reviewed and notable for small R BG ICH.  MR Angio head without contrast and Carotid Duplex BL: Pending.  Repeat CTH in 6 hours: Pending, might get an MRI Brain instead if the MRI has an open slot for her.  MRI Brain: Pending. Impression   Jacqueline Yates is a 87 y.o. female with PMH significant for HLD, HTN, hypothyroidism, chronic R eye blindness 2/2 glaucoma, prior left humerus fracture and left hip replacement who presents with a fall at [employer], found to have a R BG ICH with an ICH score of 0. NIHSS of 2 for chronic R eye blindness, otherwise I think the mild L sided weaknes is probably due to pain from her L shoulder and humerus and known L hip replacement.  Primary Diagnosis:  Right Basal Ganglia ICH  Secondary Diagnosis: Hypertensive Emergency Fall L forehead  laceration Hypothyroidism  Recommendations   Right Basal Ganglia ICH: - Admit to ICU - Stability scan in 6 hours or STAT with any neurological decline - Frequent neuro checks; q77min for 1 hour, then q1hour - No antiplatelets or anticoagulants due to ICH - SCD for DVT prophylaxis, pharmacological DVT ppx at 24 hours if ICH is stable - Blood pressure control with goal systolic 120 - 140, cleverplex and labetalol PRN - Stroke labs, HgbA1c, fasting lipid panel - MRI brain with and without contrast when stabilized to evaluate for underlying mass - MRA without contrast of the brain and Vasc US carotid duplex to evaluate for underlying vascular abnormality. - Risk factor modification - Echocardiogram - PT consult, OT consult, Speech consult. - Stroke team to follow - Not on any anticoagulation.  Hypertensive Emergency: - On cleviprex, gaol is less than 140/90 ffor 24 hours  L forehead laceration: V shaped, stable and sutured in the ED at [facility] - Appears stable.  Hypothyroidism: - Continue home synthroid.  Patient's code status updated. I also spoke to patient's daughter Jacqueline Yates over phone and updated her.  ______________________________________________________________________  This patient  is critically ill and at significant risk of neurological worsening, death and care requires constant monitoring of vital signs, hemodynamics,respiratory and cardiac monitoring, neurological assessment, discussion with family, other specialists and medical decision making of high complexity. I spent 35 minutes of neurocritical care time  in the care of  this patient. This was time spent independent of any time provided by nurse practitioner or PA.  Erick Blinks [facility] Pager Number [phone] 08/24/2020  8:49 PM   Thank you for the opportunity to take part in the care of this patient. If you have any further questions, please contact the neurology consultation  attending.  Signed,  Erick Blinks [facility] Pager Number [phone] _ _ _   _ __   _ __ _ _  __ __   _ __   __ _
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
## 2020-08-25 ENCOUNTER — Inpatient Hospital Stay (HOSPITAL_COMMUNITY): Payer: Medicare HMO

## 2020-08-25 DIAGNOSIS — I6389 Other cerebral infarction: Secondary | ICD-10-CM | POA: Diagnosis not present

## 2020-08-25 DIAGNOSIS — I639 Cerebral infarction, unspecified: Secondary | ICD-10-CM | POA: Diagnosis not present

## 2020-08-25 DIAGNOSIS — I619 Nontraumatic intracerebral hemorrhage, unspecified: Secondary | ICD-10-CM

## 2020-08-25 LAB — ECHOCARDIOGRAM COMPLETE
AR max vel: 2.79 cm2
AV Area VTI: 2.87 cm2
AV Area mean vel: 2.8 cm2
AV Mean grad: 8 mmHg
AV Peak grad: 16.5 mmHg
Ao pk vel: 2.03 m/s
Area-P 1/2: 2.26 cm2
Calc EF: 66.5 %
Height: 59 in
MV VTI: 2.39 cm2
S' Lateral: 0.6 cm
Single Plane A2C EF: 68.4 %
Single Plane A4C EF: 63.9 %
Weight: 1584 oz

## 2020-08-25 LAB — MRSA NEXT GEN BY PCR, NASAL: MRSA by PCR Next Gen: NOT DETECTED

## 2020-08-25 MED ORDER — LABETALOL HCL 5 MG/ML IV SOLN: 10.0000 mg | INTRAVENOUS | Status: DC | PRN

## 2020-08-25 MED ORDER — HYDRALAZINE HCL 20 MG/ML IJ SOLN
10.0000 mg | INTRAMUSCULAR | Status: DC | PRN
  Administered 2020-08-25: 10 mg via INTRAVENOUS
  Filled 2020-08-25: qty 1

## 2020-08-25 MED ORDER — PANTOPRAZOLE SODIUM 40 MG PO TBEC
40.0000 mg | DELAYED_RELEASE_TABLET | Freq: Every day | ORAL | Status: DC
  Administered 2020-08-25: 40 mg via ORAL
  Filled 2020-08-25: qty 1

## 2020-08-25 MED ORDER — ATENOLOL 50 MG PO TABS
25.0000 mg | ORAL_TABLET | Freq: Every day | ORAL | Status: DC
  Administered 2020-08-25 – 2020-08-26 (×2): 25 mg via ORAL
  Filled 2020-08-25 (×2): qty 1

## 2020-08-25 NOTE — Progress Notes (Addendum)
STROKE TEAM PROGRESS NOTE   SUBJECTIVE (INTERVAL HISTORY) She is laying in bed, arouseable to voice.Patient is alert and oriented. She tells [place] she fell and hit her head. Cleviprex gtt is off  MRI scan of the brain shows stable appearance of the right basal ganglia hemorrhage without significant cytotoxic edema, midline shift, herniation or hydrocephalus or intraventricular extension.  Blood pressure adequately controlled she is off Cleviprex drip.  OBJECTIVE Vitals:   08/25/20 1500 08/25/20 1531 08/25/20 1600 08/25/20 1700  BP:   (!) 130/56 (!) 155/55  Pulse: 75  66 62  Resp: (!) 22  15 16   Temp:  98.8 F (37.1 C)    TempSrc:  Oral    SpO2: 100%  99% 98%  Weight:      Height:        CBC:  Recent Labs  Lab 08/24/20 1507  WBC 8.1  NEUTROABS 5.8  HGB 13.3  HCT 41.8  MCV 94.4  PLT 185    Basic Metabolic Panel:  Recent Labs  Lab 08/24/20 1507  NA 140  K 3.9  CL 106  CO2 26  GLUCOSE 99  BUN 20  CREATININE 1.10*  CALCIUM 9.3    Lipid Panel:  Recent Labs  Lab 08/24/20 2230  CHOL 196  TRIG 69  HDL 61  CHOLHDL 3.2  VLDL 14  LDLCALC 08/26/20*   HgbA1c:  Lab Results  Component Value Date   HGBA1C 5.5 08/24/2020   Urine Drug Screen: No results found for: LABOPIA, COCAINSCRNUR, LABBENZ, AMPHETMU, THCU, LABBARB  Alcohol Level No results found for: [facility]  IMAGING  Results for orders placed or performed during the hospital encounter of 08/24/20  MR BRAIN WO CONTRAST   Narrative   CLINICAL DATA:  Headache.  Intracranial hemorrhage.  EXAM: MRI HEAD WITHOUT CONTRAST  MRA HEAD WITHOUT CONTRAST  TECHNIQUE: Multiplanar, multi-echo pulse sequences of the brain and surrounding structures were acquired without intravenous contrast. Angiographic images of the Circle of Willis were acquired using MRA technique without intravenous contrast.  COMPARISON:  No pertinent prior exam.  FINDINGS: MRI HEAD FINDINGS  Brain: Unchanged size of intraparenchymal hematoma of  the right thalamus and internal capsule. Small amount of subdural blood at the posterior left convexity. No midline shift or other mass effect. Small amount of right frontal subarachnoid blood, unchanged. Old left basal ganglia small vessel infarct. There is multifocal hyperintense T2-weighted signal within the white matter. Generalized volume loss without a clear lobar predilection. The midline structures are normal.  Vascular: Major flow voids are preserved.  Skull and upper cervical spine: Normal calvarium and skull base. Visualized upper cervical spine and soft tissues are normal.  Sinuses/Orbits:No paranasal sinus fluid levels or advanced mucosal thickening. No mastoid or middle ear effusion. Right phthisis bulbi.  MRA HEAD FINDINGS  POSTERIOR CIRCULATION:  --Vertebral arteries: Normal  --Inferior cerebellar arteries: Normal.  --Basilar artery: Normal.  --Superior cerebellar arteries: Normal.  --Posterior cerebral arteries: Narrowing of the right P1-2 junction. Otherwise normal.  ANTERIOR CIRCULATION:  --Intracranial internal carotid arteries: Normal.  --Anterior cerebral arteries (ACA): Normal.  --Middle cerebral arteries (MCA): Normal.  ANATOMIC VARIANTS: None  IMPRESSION: 1. Unchanged size of intraparenchymal hematoma of the right thalamus and internal capsule. 2. Small amount of right frontal subarachnoid and left posterior convexity subdural hemorrhage. 3. No emergent large vessel occlusion or high-grade stenosis. 4. Narrowing of the right PCA P1-2 junction.   Electronically Signed   By: 08/26/20 M.D.   On: 08/24/2020 21:51  MR MRA HEAD WO CONTRAST   Narrative   CLINICAL DATA:  Headache.  Intracranial hemorrhage.  EXAM: MRI HEAD WITHOUT CONTRAST  MRA HEAD WITHOUT CONTRAST  TECHNIQUE: Multiplanar, multi-echo pulse sequences of the brain and surrounding structures were acquired without intravenous contrast. Angiographic images of the  Circle of Willis were acquired using MRA technique without intravenous contrast.  COMPARISON:  No pertinent prior exam.  FINDINGS: MRI HEAD FINDINGS  Brain: Unchanged size of intraparenchymal hematoma of the right thalamus and internal capsule. Small amount of subdural blood at the posterior left convexity. No midline shift or other mass effect. Small amount of right frontal subarachnoid blood, unchanged. Old left basal ganglia small vessel infarct. There is multifocal hyperintense T2-weighted signal within the white matter. Generalized volume loss without a clear lobar predilection. The midline structures are normal.  Vascular: Major flow voids are preserved.  Skull and upper cervical spine: Normal calvarium and skull base. Visualized upper cervical spine and soft tissues are normal.  Sinuses/Orbits:No paranasal sinus fluid levels or advanced mucosal thickening. No mastoid or middle ear effusion. Right phthisis bulbi.  MRA HEAD FINDINGS  POSTERIOR CIRCULATION:  --Vertebral arteries: Normal  --Inferior cerebellar arteries: Normal.  --Basilar artery: Normal.  --Superior cerebellar arteries: Normal.  --Posterior cerebral arteries: Narrowing of the right P1-2 junction. Otherwise normal.  ANTERIOR CIRCULATION:  --Intracranial internal carotid arteries: Normal.  --Anterior cerebral arteries (ACA): Normal.  --Middle cerebral arteries (MCA): Normal.  ANATOMIC VARIANTS: None  IMPRESSION: 1. Unchanged size of intraparenchymal hematoma of the right thalamus and internal capsule. 2. Small amount of right frontal subarachnoid and left posterior convexity subdural hemorrhage. 3. No emergent large vessel occlusion or high-grade stenosis. 4. Narrowing of the right PCA P1-2 junction.   Electronically Signed   By: Deatra RobinsonKevin  Herman M.D.   On: 08/24/2020 21:51   CT HEAD WO CONTRAST (5MM)   Narrative   CLINICAL DATA:  Facial laceration after fall.  EXAM: CT HEAD WITHOUT  CONTRAST  CT MAXILLOFACIAL WITHOUT CONTRAST  CT CERVICAL SPINE WITHOUT CONTRAST  TECHNIQUE: Multidetector CT imaging of the head, cervical spine, and maxillofacial structures were performed using the standard protocol without intravenous contrast. Multiplanar CT image reconstructions of the cervical spine and maxillofacial structures were also generated.  COMPARISON:  None available currently.  FINDINGS: CT HEAD FINDINGS  Brain: Mild diffuse cortical atrophy is noted. 15 x 8 mm intraparenchymal hemorrhage is noted in the right basal ganglia. Ventricular size is within normal limits. No midline shift is noted.  Vascular: No hyperdense vessel or unexpected calcification.  Skull: Normal. Negative for fracture or focal lesion.  Other: None.  CT MAXILLOFACIAL FINDINGS  Osseous: No fracture or mandibular dislocation. No destructive process.  Orbits: Right globe prosthesis is noted with other postsurgical changes noted in the right orbit. Left orbit is unremarkable.  Sinuses: Clear.  Soft tissues: Negative.  CT CERVICAL SPINE FINDINGS  Alignment: Minimal grade 1 anterolisthesis of C5-6 is noted secondary to posterior facet joint hypertrophy.  Skull base and vertebrae: No acute fracture. No primary bone lesion or focal pathologic process.  Soft tissues and spinal canal: No prevertebral fluid or swelling. No visible canal hematoma.  Disc levels: Severe degenerative disc disease is noted at C5-6 and C6-7.  Upper chest: Negative.  Other: Degenerative changes are seen involving posterior facet joints bilaterally.  IMPRESSION: 15 x 8 mm right basal ganglia intraparenchymal hemorrhage is noted. Critical Value/emergent results were called by telephone at the time of interpretation on 08/24/2020 at 2:52 pm to provider  BRITNI HENDERLY , who verbally acknowledged these results.  No acute abnormality seen in maxillofacial region.  Severe multilevel degenerative disc  disease is noted in the cervical spine. No acute fracture is noted.   Electronically Signed   By: Lupita Raider M.D.   On: 08/24/2020 14:53        PHYSICAL EXAM  Physical Exam  Constitutional: Appears frail malnourished elderly Caucasian lady Psych: Affect appropriate to situation Eyes: No scleral injection HENT: Has sutured laceration above left eye. No OP obstrucion MSK: no joint deformities.  Cardiovascular: Normal rate and regular rhythm.  Respiratory: Effort normal, non-labored breathing GI: Soft.  No distension. There is no tenderness.  Skin: WDI  Neuro: Mental Status: Patient is awake, alert, oriented to person, place, poor insight into illness.  diminished attention, registration and recall.  Questions Patient is able to give a clear and coherent history. No signs of aphasia or neglect Cranial Nerves: II: Visual Fields are full. Pupils are equal, round, and reactive to light.   III,IV, VI: EOMI without ptosis or diploplia.  V: Facial sensation is symmetric to temperature VII: Facial movement is symmetric.  VIII: hearing is intact to voice X: Uvula elevates symmetrically XI: Shoulder shrug is symmetric. XII: tongue is midline without atrophy or fasciculations.  Motor: Tone is normal. Bulk is normal. 5/5 strength was present in all four extremities.  Sensory: Sensation is symmetric to light touch and temperature in the arms and legs. Cerebellar: FNF and HKS are intact bilaterally  ASSESSMENT/PLAN Ms. AUBRY RANKIN is a 87 y.o. female with history of HLD, HTN, hypothyroidism, chronic R eye blindness 2/2 glaucoma, prior left humerus fracture and left hip replacement who presents with a fall at [employer] and hit her head with a L forehead laceration. She does have chronic HTN and it has been hard to manage.  ICH score 1  Stroke:  right BG ICH secondary to Chronic HTN, (possibly hemorrhagic conversion form CVA) CT head 15 x 8 mm right basal ganglia  intraparenchymal hemorrhage MRI head stable intraparenchymal hematoma of the right thalamus and internal capsule. Small amount of right frontal subarachnoid and left posterior convexity subdural hemorrhage. MRA head No emergent large vessel occlusion or high-grade stenosis. Narrowing of the right PCA P1-2 junction Carotid Doppler  pending 2D Echo  pending LDL 121 HgbA1c 5.5 SCD's for VTE prophylaxis Diet Order             Diet regular Room service appropriate? Yes with Assist; Fluid consistency: Thin  Diet effective now                  No antithrombotic prior to admission, now on No antithrombotic.  Ongoing aggressive stroke risk factor management Therapy recommendations:  pending Disposition:  pending  Hypertension UnStable SBP goal < 160 Restart home atenolol  Long-term BP goal normotensive  Hyperlipidemia Home meds:  Zocor 20mg , resumed in hospital LDL 121, goal < 70 Continue statin at discharge  Diabetes type II HgbA1c 5.5, goal < 7.0 Controlled  Other Stroke Risk Factors Advanced age   Hospital day # 1  , NP   I have personally obtained history,examined this patient, reviewed notes, independently viewed imaging studies, participated in medical decision making and plan of care.ROS completed by me personally and pertinent positives fully documented  I have made any additions or clarifications directly to the above note. Agree with note above.  She presented with of fall likely related to small right basal ganglia hemorrhage hypertensive  etiology.  Continue close neurological monitoring and strict blood pressure control as per intracerebral hemorrhage protocol.  Goal is to keep systolic below 140 for first 24 hours and then below 160.  Mobilize out of bed.  Therapy consults.  Swallow eval.  No family available at the bedside for discussion.This patient is critically ill and at significant risk of neurological worsening, death and care requires constant  monitoring of vital signs, hemodynamics,respiratory and cardiac monitoring, extensive review of multiple databases, frequent neurological assessment, discussion with family, other specialists and medical decision making of high complexity.I have made any additions or clarifications directly to the above note.This critical care time does not reflect procedure time, or teaching time or supervisory time of PA/NP/Med Resident etc but could involve care discussion time.  I spent 30 minutes of neurocritical care time  in the care of  this patient.      Delia Heady, MD Medical Director [facility] Pager: [phone] 08/25/2020 6:15 PM  To contact Stroke Continuity provider, please refer to [email or web address]. After hours, contact General Neurology

## 2020-08-25 NOTE — Progress Notes (Signed)
Carotid artery duplex has been completed. Preliminary results can be found in CV Proc through chart review.   08/25/20 12:41 PM Olen Cordial RVT

## 2020-08-25 NOTE — Progress Notes (Signed)
*  PRELIMINARY RESULTS* Echocardiogram 2D Echocardiogram has been performed.  Neomia Dear RDCS 08/25/2020, 8:32 AM

## 2020-08-25 NOTE — Evaluation (Signed)
Physical Therapy Evaluation Patient Details Name: Jacqueline Yates MRN: [id number] DOB: 09/20/1931 Today's Date: 08/25/2020   History of Present Illness  Pt is a 87 y.o. M who presents with a fall at [facility] and hit her head. Pt found to have an acute small R BG ICH. Significant PMH: HLD, HTN, chronic R eye blindness secondary to glaucoma, prior left humerus fracture and left hip replacement.  Clinical Impression  PTA, pt lives with her daughters; she uses a RW for community ambulation and a Occupational hygienist for household ambulation. Reports 3-4 falls in past year. Pt presents with decreased functional mobility secondary to generalized weakness, balance deficits, gait abnormalities. Pt ambulating 60 feet with a walker at a min assist level. Discussed with pt daughter via phone; she reports pt will have 24/7 assist. See below for recommendations.     Follow Up Recommendations Home health PT;Supervision/Assistance - 24 hour    Equipment Recommendations  3in1 (PT)    Recommendations for Other Services       Precautions / Restrictions Precautions Precautions: Fall;Other (comment) Precaution Comments: BP < 160 Restrictions Weight Bearing Restrictions: No      Mobility  Bed Mobility Overal bed mobility: Needs Assistance Bed Mobility: Supine to Sit     Supine to sit: Min assist     General bed mobility comments: Pt preferring to exit from right side of bed, minA at trunk to bring upright    Transfers Overall transfer level: Needs assistance Equipment used: Rolling walker (2 wheeled) Transfers: Sit to/from Stand Sit to Stand: Min assist         General transfer comment: MinA to rise from edge of bed and steady  Ambulation/Gait Ambulation/Gait assistance: Min assist Gait Distance (Feet): 60 Feet Assistive device: Rolling walker (2 wheeled) Gait Pattern/deviations: Step-to pattern;Decreased step length - right;Decreased stance time - left;Steppage;Drifts right/left;Decreased  dorsiflexion - right;Decreased dorsiflexion - left Gait velocity: decreased Gait velocity interpretation: <1.31 ft/sec, indicative of household ambulator General Gait Details: Pt with step to pattern, steppage pattern on R, decreased bilateral foot clearance. Tendency to drift to left, able to correct with verbal cues. Overall, minA for balance  Stairs            Wheelchair Mobility    Modified Rankin (Stroke Patients Only) Modified Rankin (Stroke Patients Only) Pre-Morbid Rankin Score: Slight disability Modified Rankin: Moderately severe disability     Balance Overall balance assessment: Needs assistance Sitting-balance support: Feet unsupported Sitting balance-Leahy Scale: Fair     Standing balance support: Bilateral upper extremity supported Standing balance-Leahy Scale: Poor Standing balance comment: reliant on external support                             Pertinent Vitals/Pain Pain Assessment: Faces Faces Pain Scale: Hurts little more Pain Location: generalized soreness in setting of a fall, L chronic hip pain Pain Descriptors / Indicators: Sore Pain Intervention(s): Monitored during session    Home Living Family/patient expects to be discharged to:: Private residence Living Arrangements: Children (2 daughters) Available Help at Discharge: Family;Available PRN/intermittently Type of Home: House Home Access: Stairs to enter Entrance Stairs-Rails: Doctor, general practice of Steps: 2 Home Layout: Able to live on main level with bedroom/bathroom Home Equipment: Emergency planning/management officer - 2 wheels;Walker - 4 wheels;Cane - single point      Prior Function Level of Independence: Needs assistance   Gait / Transfers Assistance Needed: uses RW for community ambulation, Rollator for household ambulation  ADL's / Homemaking Assistance Needed: pt fixes breakfast        Hand Dominance        Extremity/Trunk Assessment   Upper Extremity  Assessment Upper Extremity Assessment: Defer to OT evaluation    Lower Extremity Assessment Lower Extremity Assessment: Generalized weakness    Cervical / Trunk Assessment Cervical / Trunk Assessment: Kyphotic  Communication   Communication: No difficulties  Cognition Arousal/Alertness: Awake/alert Behavior During Therapy: Flat affect Overall Cognitive Status: No family/caregiver present to determine baseline cognitive functioning                                 General Comments: A&Ox4      General Comments General comments (skin integrity, edema, etc.): VSS    Exercises     Assessment/Plan    PT Assessment Patient needs continued PT services  PT Problem List Decreased strength;Decreased activity tolerance;Decreased balance;Decreased mobility;Pain       PT Treatment Interventions DME instruction;Gait training;Functional mobility training;Therapeutic exercise;Therapeutic activities;Balance training;Patient/family education    PT Goals (Current goals can be found in the Care Plan section)  Acute Rehab PT Goals Patient Stated Goal: did not state PT Goal Formulation: With patient Time For Goal Achievement: 09/08/20 Potential to Achieve Goals: Good    Frequency Min 4X/week   Barriers to discharge        Co-evaluation               AM-PAC PT "6 Clicks" Mobility  Outcome Measure Help needed turning from your back to your side while in a flat bed without using bedrails?: A Little Help needed moving from lying on your back to sitting on the side of a flat bed without using bedrails?: A Little Help needed moving to and from a bed to a chair (including a wheelchair)?: A Little Help needed standing up from a chair using your arms (e.g., wheelchair or bedside chair)?: A Little Help needed to walk in hospital room?: A Little Help needed climbing 3-5 steps with a railing? : A Lot 6 Click Score: 17    End of Session Equipment Utilized During Treatment:  Gait belt Activity Tolerance: Patient tolerated treatment well Patient left: in chair;with call bell/phone within reach;with chair alarm set Nurse Communication: Mobility status PT Visit Diagnosis: Unsteadiness on feet (R26.81);Other abnormalities of gait and mobility (R26.89);Muscle weakness (generalized) (M62.81);History of falling (Z91.81);Difficulty in walking, not elsewhere classified (R26.2)    Time: 1191-4782 PT Time Calculation (min) (ACUTE ONLY): 24 min   Charges:   PT Evaluation $PT Eval Moderate Complexity: 1 Mod PT Treatments $Gait Training: 8-22 mins        Lillia Pauls, PT, DPT Acute Rehabilitation Services Pager [phone] Office [phone]   Norval Morton 08/25/2020, 1:00 PM

## 2020-08-25 NOTE — Progress Notes (Signed)
SLP Cancellation Note  Patient Details Name: Jacqueline Yates MRN: [id number] DOB: 1931/12/30   Cancelled treatment:        Was with OT. Will continue efforts tomorrow.   [name] 08/25/2020, 4:09 PM

## 2020-08-25 NOTE — TOC CAGE-AID Note (Signed)
Transition of Care [facility]) - CAGE-AID Screening   Patient Details  Name: Jacqueline Yates MRN: [id number] Date of Birth: June 18, 1931  Transition of Care [facility]) CM/SW Contact:    Ina Scrivens C Tarpley-Carter, LCSWA Phone Number: 08/25/2020, 1:14 PM   Clinical Narrative: Pt is unable to participate in Cage Aid.   Temica Righetti Tarpley-Carter, MSW, LCSW-A Pronouns:  She/Her/Hers [facility] Clinical Social Worker Direct Number:  [phone] [email or web address]  CAGE-AID Screening: Substance Abuse Screening unable to be completed due to: : Patient unable to participate

## 2020-08-25 NOTE — Progress Notes (Signed)
VO for SBP <160 per Dr. Pearlean Brownie.

## 2020-08-25 NOTE — Evaluation (Signed)
Occupational Therapy Evaluation Patient Details Name: Jacqueline Yates MRN: [id number] DOB: [birth year] Today's Date: 08/25/2020    History of Present Illness 87 y.o. female who presents with a fall at [facility] and hit her head. Pt found to have an acute small R BG ICH. Significant PMH: HLD, HTN, chronic R eye blindness secondary to glaucoma, prior left humerus fracture and left hip replacement.   Clinical Impression   PT admitted with R ICH. Pt currently with functional limitiations due to the deficits listed below (see OT problem list). Pt currently with executive cognitive deficits and high fall risk. Pt incontinence and lack of awareness.  Pt will benefit from skilled OT to increase their independence and safety with adls and balance to allow discharge HHOT ( daughters report to PT can manage at this level at home so agree with [facility]) .     Follow Up Recommendations  Home health OT    Equipment Recommendations  None recommended by OT    Recommendations for Other Services       Precautions / Restrictions Precautions Precautions: Fall;Other (comment) Precaution Comments: BP < 160      Mobility Bed Mobility Overal bed mobility: Needs Assistance Bed Mobility: Supine to Sit     Supine to sit: Mod assist     General bed mobility comments: exiting on the Left side of the bed. pt was unable to roll and was rather scoot feet to edge and attempting to long sit with rail. pt states "i cant get myself up"    Transfers Overall transfer level: Needs assistance Equipment used: Rolling walker (2 wheeled) Transfers: Sit to/from Stand Sit to Stand: Min assist         General transfer comment: MinA to rise from edge of bed and steady    Balance Overall balance assessment: Needs assistance Sitting-balance support: Feet unsupported Sitting balance-Leahy Scale: Fair     Standing balance support: Bilateral upper extremity supported Standing balance-Leahy Scale: Poor Standing balance  comment: reliant on external support                           ADL either performed or assessed with clinical judgement   ADL Overall ADL's : Needs assistance/impaired Eating/Feeding: Set up   Grooming: Applying deodorant       Lower Body Bathing: Moderate assistance       Lower Body Dressing: Minimal assistance;Sit to/from stand   Toilet Transfer: Minimal assistance;Regular Toilet;RW   Toileting- Clothing Manipulation and Hygiene: Maximal assistance Toileting - Clothing Manipulation Details (indicate cue type and reason): pt with incontinence of stool in brief adn not aware. pt noted to have some redness to buttock. pt with dried stool so hygiene to help with skin integrity provided. pt attempting to wipe stool from back to front .     Functional mobility during ADLs: Minimal assistance;Rolling walker       Vision Baseline Vision/History: Legally blind;Glaucoma;Cataracts Vision Assessment?: No apparent visual deficits     Perception     Praxis      Pertinent Vitals/Pain Pain Assessment: No/denies pain Faces Pain Scale: Hurts little more Pain Location: generalized soreness in setting of a fall, L chronic hip pain Pain Descriptors / Indicators: Sore Pain Intervention(s): Monitored during session;Repositioned     Hand Dominance Right   Extremity/Trunk Assessment Upper Extremity Assessment Upper Extremity Assessment: LUE deficits/detail LUE Deficits / Details: decreased ROM with previous fx. pt does not elevate to full 90 degrees flexion  Lower Extremity Assessment Lower Extremity Assessment: Defer to PT evaluation   Cervical / Trunk Assessment Cervical / Trunk Assessment: Kyphotic   Communication Communication Communication: No difficulties   Cognition Arousal/Alertness: Awake/alert Behavior During Therapy: Flat affect Overall Cognitive Status: No family/caregiver present to determine baseline cognitive functioning                                  General Comments: A&Ox4 able to verbalize how injury occurred and what her current injuries are. pt however demonstrates decreased cognitive problem solving once moving. pt attempting to don a soiled pull up with stool. pt reaching outside base of support with risk of falling on commode x2 with cues for safety. pt could benefit from further executive problem solving as she manages her own bills by writing checks.   General Comments  VSS BP assessed during session and stable    Exercises     Shoulder Instructions      Home Living Family/patient expects to be discharged to:: Private residence Living Arrangements: Children (2 daughters) Available Help at Discharge: Family;Available PRN/intermittently Type of Home: House Home Access: Stairs to enter [employer] of Steps: 2 Entrance Stairs-Rails: Right;Left Home Layout: Able to live on main level with bedroom/bathroom     Bathroom Shower/Tub: Chief Strategy Officer: Standard     Home Equipment: Emergency planning/management officer - 2 wheels;Walker - 4 wheels;Cane - single point;Bedside commode   Additional Comments: daughter drives to store but patient still does her own shopping      Prior Functioning/Environment Level of Independence: Needs assistance  Gait / Transfers Assistance Needed: uses RW for community ambulation, Rollator for household ambulation ADL's / Homemaking Assistance Needed: pt fixes breakfast            OT Problem List: Decreased strength;Decreased activity tolerance;Impaired balance (sitting and/or standing);Decreased safety awareness;Decreased cognition;Decreased knowledge of use of DME or AE;Decreased knowledge of precautions      OT Treatment/Interventions: Self-care/ADL training;Therapeutic exercise;Neuromuscular education;Energy conservation;DME and/or AE instruction;Manual therapy;Therapeutic activities;Cognitive remediation/compensation;Patient/family education;Balance  training    OT Goals(Current goals can be found in the care plan section) Acute Rehab OT Goals Patient Stated Goal: to get back home OT Goal Formulation: With patient Time For Goal Achievement: 09/08/20 Potential to Achieve Goals: Good  OT Frequency: Min 2X/week   Barriers to D/C:            Co-evaluation              AM-PAC OT "6 Clicks" Daily Activity     Outcome Measure Help from another person eating meals?: None Help from another person taking care of personal grooming?: None Help from another person toileting, which includes using toliet, bedpan, or urinal?: A Little Help from another person bathing (including washing, rinsing, drying)?: A Little Help from another person to put on and taking off regular upper body clothing?: A Little Help from another person to put on and taking off regular lower body clothing?: A Little 6 Click Score: 20   End of Session Equipment Utilized During Treatment: Gait belt;Rolling walker Nurse Communication: Mobility status;Precautions  Activity Tolerance: Patient tolerated treatment well Patient left: in chair;with call bell/phone within reach;with chair alarm set  OT Visit Diagnosis: Unsteadiness on feet (R26.81);Muscle weakness (generalized) (M62.81)                Time: 7902-4097 OT Time Calculation (min): 22 min Charges:  OT General Charges $  OT Visit: 1 Visit OT Evaluation $OT Eval Moderate Complexity: 1 Mod   Brynn, OTR/L  Acute Rehabilitation Services Pager: [phone] Office: [phone] .   Mateo Flow 08/25/2020, 3:45 PM

## 2020-08-26 DIAGNOSIS — I610 Nontraumatic intracerebral hemorrhage in hemisphere, subcortical: Principal | ICD-10-CM

## 2020-08-26 MED ORDER — SODIUM CHLORIDE 0.9 % IV BOLUS
500.0000 mL | Freq: Once | INTRAVENOUS | Status: AC
  Administered 2020-08-26: 500 mL via INTRAVENOUS

## 2020-08-26 NOTE — Evaluation (Addendum)
Speech Language Pathology Evaluation Patient Details Name: Jacqueline Yates MRN: [id number] DOB: 05/02/1931 Today's Date: 08/26/2020 Time: 1829-9371 SLP Time Calculation (min) (ACUTE ONLY): 21 min  Problem List:  Patient Active Problem List   Diagnosis Date Noted   Intraparenchymal hemorrhage of brain (HCC) 08/24/2020   ICH (intracerebral hemorrhage) (HCC) 08/24/2020   Closed fracture of left proximal humerus 10/03/2019   Past Medical History:  Past Medical History:  Diagnosis Date   Hyperlipemia    Hypertension    Thyroid disease    Past Surgical History:  Past Surgical History:  Procedure Laterality Date   hip sx  left due to fall 0ct 2020     HPI:  Pt is a 87 y.o. M who presents with a fall at [facility] and hit her head. Pt found to have an acute small R BG ICH. Significant PMH: HLD, HTN, chronic R eye blindness secondary to glaucoma, prior left humerus fracture and left hip replacement.   Assessment / Plan / Recommendation Clinical Impression  Pt exhibits impairments in the areas of awareness, problem solving, attention, information processing and visuospatial awareness. She is oriented, recalled 4/5 words independently however recall of recent information appears decreased; reported degree of memory difficulty prior to admission. She has significant difficulty processing 2 part/2 step level of information and per OT, processing verbal information and executing tasks with concern for safety. She manages her medication and was able name however needs supervsion. Language and speech intelligibility is within functional limits. Her score on the SLUMS was 14/30. She would benefit from ST while on acute and HH and needs 24 hour supervision.    SLP Assessment  SLP Recommendation/Assessment: Patient needs continued Speech Lanaguage Pathology Services SLP Visit Diagnosis: Cognitive communication deficit (R41.841)    Follow Up Recommendations   (TBD)    Frequency and Duration min 2x/week   2 weeks      SLP Evaluation Cognition  Overall Cognitive Status: Difficult to assess (suspect mildly worse than baseline) Arousal/Alertness: Awake/alert Orientation Level: Oriented X4 Attention: Sustained Sustained Attention: Appears intact Memory: Impaired (4/5 independent word recall) Memory Impairment: Retrieval deficit Awareness: Impaired Awareness Impairment: Intellectual impairment;Emergent impairment;Anticipatory impairment Problem Solving: Impaired Problem Solving Impairment: Functional basic Executive Function: Self Monitoring;Self Correcting Safety/Judgment: Impaired       Comprehension  Auditory Comprehension Overall Auditory Comprehension: Appears within functional limits for tasks assessed Visual Recognition/Discrimination Discrimination: Not tested Reading Comprehension Reading Status: Not tested    Expression Expression Primary Mode of Expression: Verbal Verbal Expression Overall Verbal Expression: Appears within functional limits for tasks assessed Pragmatics: No impairment Written Expression Dominant Hand: Right Written Expression: Not tested   Oral / Motor  Oral Motor/Sensory Function Overall Oral Motor/Sensory Function: Within functional limits Motor Speech Overall Motor Speech: Appears within functional limits for tasks assessed Articulation: Within functional limitis Intelligibility: Intelligible Motor Planning: Witnin functional limits   GO                    Jacqueline Yates 08/26/2020, 11:38 AM  Breck Coons Lonell Face.Ed Nurse, children's [phone] Office [phone]

## 2020-08-26 NOTE — Progress Notes (Signed)
Alerted Neuro MD, Dr. Ezzie Dural, that the patient's BP has been soft ever since administration of Hydralizine earlier in the shift.  Most recent BP is 81/66 (73).  Was given orders to give 500cc bolus of NS. Will administer and continue to observe and act accordingly.

## 2020-08-26 NOTE — TOC Transition Note (Signed)
Transition of Care [facility]) - CM/SW Discharge Note   Patient Details  Name: Jacqueline Yates MRN: [id number] Date of Birth: [birth year]  Transition of [facility]) CM/SW Contact:  Glennon Mac, RN Phone Number: 08/26/2020, 2:40 PM   Clinical Narrative:  87 y.o. female who presents with a fall at [facility] and hit her head. Pt found to have an acute small R BG ICH. Prior to admission, patient requires assistance with ADLs; uses rolling walker for ambulation.  PT/OT/ST recommending home health follow-up with 24-hour supervision.  Spoke with patient's daughter, Claris Che: She states that patient will have 24-hour supervision at all times at discharge, provided by patient's two daughters.  Daughter reports home health in the past; referral to [facility] health for follow-up services, per choice/insurance contracts.  No DME needed, per daughter; she states that patient has rolling walker and 3 and 1 bedside commode at home.    Final next level of care: Home w Home Health Services Barriers to Discharge: Barriers Resolved   Patient Goals and CMS Choice Patient states their goals for this hospitalization and ongoing recovery are:: to go home CMS Medicare.gov Compare Post Acute Care list provided to:: Patient Represenative (must comment) (daughter) Choice offered to / list presented to : Adult Children                        Discharge Plan and Services   Discharge Planning Services: CM Consult                      HH Arranged: PT, OT, Speech Therapy HH Agency: [facility] Date [facility] Agency Contacted: 08/26/20 Time HH Agency Contacted: 1400 Representative spoke with at [facility] Agency: Lorenza Chick  Social Determinants of Health (SDOH) Interventions     Readmission Risk Interventions No flowsheet data found.  Quintella Baton, RN, BSN  Trauma/Neuro ICU Case Manager [phone]

## 2020-08-26 NOTE — Discharge Summary (Addendum)
Stroke Discharge Summary  Patient ID: Jacqueline Yates       MRN: [id number]      DOB: Dec 05, 1931  Date of Admission: 08/24/2020 Date of Discharge: 08/26/2020  Attending Physician: Dr. Pearlean Brownie  Consultant(s):    None  Patient's PCP:  Kirstie Peri, MD  DISCHARGE DIAGNOSIS:  Principal Problem:   ICH (intracerebral hemorrhage) (HCC) right basal ganglia likely of hypertensive etiology Active Problems:   Intraparenchymal hemorrhage of brain (HCC)   Allergies as of 08/26/2020   No Known Allergies      Medication List     STOP taking these medications    traMADol 50 MG tablet Commonly known as: ULTRAM       TAKE these medications    atenolol 25 MG tablet Commonly known as: TENORMIN Take 25 mg by mouth daily.   levothyroxine 25 MCG tablet Commonly known as: SYNTHROID Take 25 mcg by mouth daily before breakfast.   simvastatin 20 MG tablet Commonly known as: ZOCOR Take 20 mg by mouth at bedtime.   VITAMIN D PO Take 1 tablet by mouth daily at 6 (six) AM.       Pertinent Imaging   08/24/20 CT Head WO Contrast  15 x 8 mm right basal ganglia intraparenchymal hemorrhage   08/24/20 MRI Brain WO Contrast  1. Unchanged size of intraparenchymal hematoma of the right thalamus and internal capsule. 2. Small amount of right frontal subarachnoid and left posterior convexity subdural hemorrhage.  08/24/20 MR Angio Head WO Contrast  No emergent large vessel occlusion or high-grade stenosis, narrowing of the right PCA P1-2 junction.  08/26/20 CUS  Right Carotid: Velocities in the right ICA are consistent with a 1-39% stenosis.   Left Carotid: Velocities in the left ICA are consistent with a 1-39% stenosis.   08/26/20 Echo Complete   1. Small resting LVOT gradient with SAM . Left ventricular ejection fraction, by estimation, is 60 to 65%. The left ventricle has normal function. The left ventricle has no regional wall motion abnormalities.  There is severe asymmetric left ventricular  hypertrophy of the basal and septal segments. Left ventricular diastolic parameters are indeterminate.   2. Right ventricular systolic function is normal. The right ventricular size is normal.   3. The mitral valve is degenerative. No evidence of mitral valve regurgitation. No evidence of mitral stenosis. Severe mitral annular calcification.   4. The aortic valve is calcified. There is moderate calcification of the aortic valve. There is moderate thickening of the aortic valve. Aortic valve regurgitation is not visualized. Mild aortic valve stenosis.   5. The inferior vena cava is normal in size with greater than 50% respiratory variability, suggesting right atrial pressure of 3 mmHg.   History of Present Illness  Jacqueline Yates is a 87 y.o. female with PMH significant for HLD, HTN, hypothyroidism, chronic R eye blindness 2/2 glaucoma, prior left humerus fracture and left hip replacement who presents with a fall at [employer] and hit her head with a L forehead laceration. She was initially taken to [facility] ED where she was found to have an acute small R BG ICH. She was noted to have elevated BP. She was started on Cleviprex and transferred to [facility] for further evaluation and workup.   She endorses chronic HTN which has been difficult to manage. Lives the daughter who check her blood pressure frequently and it varies all the time. Sometimes will be high, other times low. Takes her blood pressure meds on time.  No prior hx of strokes, no hx of ICH. Does have a hx of falls. Does not smoke.   mRS:3 tPA/Thrombectomy: No, ICH ICH score: 1  Discharge Examination  Constitutional: Appears frail malnourished elderly Caucasian lady HENT: Has sutured laceration above left eye. No OP obstrucion Cardiovascular: Normal rate and regular rhythm.  Respiratory: Effort normal, non-labored breathing   Neuro: Mental Status: Patient is awake, alert, oriented to person, place, poor insight into illness.  Diminished attention, registration and recall. She follows commands.  Cranial Nerves: II: Visual Fields are full. Pupils are equal, round, and reactive to light.   III,IV, VI: EOMI without ptosis or diploplia.  V: Facial sensation is symmetric to temperature VII: Facial movement is symmetric.  VIII: hearing is intact to voice X: Uvula elevates symmetrically XI: Shoulder shrug is symmetric. XII: tongue is midline without atrophy or fasciculations.   Motor: Tone is normal. Bulk is normal. 5/5 strength was present in all four extremities.   Sensory: Sensation is symmetric to light touch and temperature in the arms and legs.  Cerebellar: FNF and HKS are intact bilaterally  Assessment and Plan:  Jacqueline Yates is a 87 y.o. female with history of HLD, HTN, hypothyroidism, chronic R eye blindness 2/2 glaucoma, prior left humerus fracture and left hip replacement who presents with a fall at [employer] and hit her head with a L forehead laceration. She does have chronic HTN and it has been hard to manage. ICH score 1   Stroke:  Right BG ICH secondary to Chronic HTN, (possibly hemorrhagic conversion form CVA) CT head 15 x 8 mm right basal ganglia intraparenchymal hemorrhage MRI head stable intraparenchymal hematoma of the right thalamus and internal capsule. Small amount of right frontal subarachnoid and left posterior convexity subdural hemorrhage. MRA head No emergent large vessel occlusion or high-grade stenosis. Narrowing of the right PCA P1-2 junction Carotid Doppler w/no significant stenosis  2D Echo w/EF 60 to 65 %, LVH  LDL 121 HgbA1c 5.5 SCD's for VTE prophylaxis while hospitalized  No antithrombotic prior to admission and no antiplatelet therapy now given bleed  Ongoing aggressive stroke risk factor management Therapy recommendations:  Home health OT/PT with 24/7 supervision. Patient lives with her daughters.  Ambualtory referral to neurology placed at discharge     Hypertension SBP goal < 160, pressure trending normotensive at discharge, continued home Atenolol this admission    Hyperlipidemia Home meds:  Zocor 20mg , resumed in hospital LDL 121, goal < 70 Continue statin at discharge  Stroke Dysphagia Screening Passed bedside swallow evaluation for a regular diet this admission    Diabetes type II HgbA1c 5.5, goal < 7.0   Other Stroke Risk Factors Advanced age  , NP  Stroke Service Nurse Practitioner Patient seen and discussed with attending physician Dr. Stark Jock   32 minutes were spent preparing discharge  I have personally obtained history,examined this patient, reviewed notes, independently viewed imaging studies, participated in medical decision making and plan of care.ROS completed by me personally and pertinent positives fully documented  I have made any additions or clarifications directly to the above note. Agree with note above.    Pearlean Brownie, MD Medical Director [facility] Stroke Center Pager: [phone] 08/26/2020 3:22 PM

## 2020-08-26 NOTE — Discharge Instructions (Addendum)
Jacqueline Yates,   You have been hospitalized due to a bleed that occurred to the right side of your brain, affecting the left side of your body.   We believe that your bleed could have happened due to elevated blood pressure. Continue to take your Atenolol at home for blood pressure control.  Follow up with your PCP ( you or your family are to make this appointment) and with [place] in the stroke clinic. Expect a call regarding scheduling your stroke clinic appointment.   It was a pleasure caring for you. Your medical team wishes you the best.   - The Stroke Team

## 2020-08-26 NOTE — Progress Notes (Addendum)
Physical Therapy Treatment Patient Details Name: Jacqueline Yates MRN: [id number] DOB: 1931/11/18 Today's Date: 08/26/2020    History of Present Illness Pt is a 87 y.o. M who presents with a fall at [facility] and hit her head. Pt found to have an acute small R BG ICH. Significant PMH: HLD, HTN, chronic R eye blindness secondary to glaucoma, prior left humerus fracture and left hip replacement.    PT Comments    Pt making steady progress towards her physical therapy goals, exhibiting improved balance and increased ambulation distance. Pt requiring min guard assist for transfers, ambulating 120 feet with a walker at a min assist. Requires cues and intermittent guidance with managing walker (tendency for left drift). BP 147/52 sitting edge of bed. D/c plan remains appropriate.     Follow Up Recommendations  Home health PT;Supervision/Assistance - 24 hour     Equipment Recommendations  3in1 (PT)    Recommendations for Other Services       Precautions / Restrictions Precautions Precautions: Fall;Other (comment) Precaution Comments: BP < 160 Restrictions Weight Bearing Restrictions: No    Mobility  Bed Mobility Overal bed mobility: Needs Assistance Bed Mobility: Supine to Sit     Supine to sit: Min assist     General bed mobility comments: Pt needing light minA to pull trunk to upright position    Transfers Overall transfer level: Needs assistance Equipment used: Rolling walker (2 wheeled) Transfers: Sit to/from Stand Sit to Stand: Min guard         General transfer comment: Min guard for safety  Ambulation/Gait Ambulation/Gait assistance: Editor, commissioning (Feet): 120 Feet Assistive device: Rolling walker (2 wheeled) Gait Pattern/deviations: Step-to pattern;Decreased step length - right;Decreased stance time - left;Drifts right/left;Decreased dorsiflexion - right;Decreased dorsiflexion - left Gait velocity: decreased Gait velocity interpretation: <1.8 ft/sec,  indicate of risk for recurrent falls General Gait Details: Improved fluidity of gait today, no RLE steppage, step to pattern, decreased stride length. Min guard overall for balance, but requires intermittent assist for walker with turns and verbal cues for environmental negotiation   Stairs             Wheelchair Mobility    Modified Rankin (Stroke Patients Only) Modified Rankin (Stroke Patients Only) Pre-Morbid Rankin Score: Slight disability Modified Rankin: Moderately severe disability     Balance Overall balance assessment: Needs assistance Sitting-balance support: Feet unsupported Sitting balance-Leahy Scale: Fair     Standing balance support: Bilateral upper extremity supported Standing balance-Leahy Scale: Poor Standing balance comment: reliant on external support                            Cognition Arousal/Alertness: Awake/alert Behavior During Therapy: Flat affect Overall Cognitive Status: No family/caregiver present to determine baseline cognitive functioning                                 General Comments: Oriented to date, min cues for environmental negotiation      Exercises General Exercises - Lower Extremity Long Arc Quad: Both;10 reps;Seated Hip Flexion/Marching: Both;10 reps;Seated    General Comments        Pertinent Vitals/Pain Pain Assessment: Faces Faces Pain Scale: Hurts a little bit Pain Location: generalized soreness in setting of a fall, L chronic hip pain Pain Descriptors / Indicators: Sore Pain Intervention(s): Monitored during session    Home Living  Prior Function            PT Goals (current goals can now be found in the care plan section) Acute Rehab PT Goals Patient Stated Goal: did not state PT Goal Formulation: With patient Time For Goal Achievement: 09/08/20 Potential to Achieve Goals: Good Progress towards PT goals: Progressing toward goals     Frequency    Min 4X/week      PT Plan Current plan remains appropriate    Co-evaluation              AM-PAC PT "6 Clicks" Mobility   Outcome Measure  Help needed turning from your back to your side while in a flat bed without using bedrails?: A Little Help needed moving from lying on your back to sitting on the side of a flat bed without using bedrails?: A Little Help needed moving to and from a bed to a chair (including a wheelchair)?: A Little Help needed standing up from a chair using your arms (e.g., wheelchair or bedside chair)?: A Little Help needed to walk in hospital room?: A Little Help needed climbing 3-5 steps with a railing? : A Lot 6 Click Score: 17    End of Session Equipment Utilized During Treatment: Gait belt Activity Tolerance: Patient tolerated treatment well Patient left: in chair;with call bell/phone within reach;with chair alarm set Nurse Communication: Mobility status PT Visit Diagnosis: Unsteadiness on feet (R26.81);Other abnormalities of gait and mobility (R26.89);Muscle weakness (generalized) (M62.81);History of falling (Z91.81);Difficulty in walking, not elsewhere classified (R26.2)     Time: 6387-5643 PT Time Calculation (min) (ACUTE ONLY): 22 min  Charges:  $Gait Training: 8-22 mins                     Lillia Pauls, PT, DPT Acute Rehabilitation Services Pager [phone] Office [phone]     Norval Morton 08/26/2020, 8:42 AM

## 2020-08-28 DIAGNOSIS — H409 Unspecified glaucoma: Secondary | ICD-10-CM | POA: Diagnosis not present

## 2020-08-28 DIAGNOSIS — I119 Hypertensive heart disease without heart failure: Secondary | ICD-10-CM | POA: Diagnosis not present

## 2020-08-28 DIAGNOSIS — E079 Disorder of thyroid, unspecified: Secondary | ICD-10-CM | POA: Diagnosis not present

## 2020-08-28 DIAGNOSIS — S0542XD Penetrating wound of orbit with or without foreign body, left eye, subsequent encounter: Secondary | ICD-10-CM | POA: Diagnosis not present

## 2020-08-28 DIAGNOSIS — E119 Type 2 diabetes mellitus without complications: Secondary | ICD-10-CM | POA: Diagnosis not present

## 2020-08-28 DIAGNOSIS — H5461 Unqualified visual loss, right eye, normal vision left eye: Secondary | ICD-10-CM | POA: Diagnosis not present

## 2020-08-28 DIAGNOSIS — I69154 Hemiplegia and hemiparesis following nontraumatic intracerebral hemorrhage affecting left non-dominant side: Secondary | ICD-10-CM | POA: Diagnosis not present

## 2020-08-28 DIAGNOSIS — E785 Hyperlipidemia, unspecified: Secondary | ICD-10-CM | POA: Diagnosis not present

## 2020-08-28 DIAGNOSIS — I080 Rheumatic disorders of both mitral and aortic valves: Secondary | ICD-10-CM | POA: Diagnosis not present

## 2020-08-31 DIAGNOSIS — E119 Type 2 diabetes mellitus without complications: Secondary | ICD-10-CM | POA: Diagnosis not present

## 2020-08-31 DIAGNOSIS — H5461 Unqualified visual loss, right eye, normal vision left eye: Secondary | ICD-10-CM | POA: Diagnosis not present

## 2020-08-31 DIAGNOSIS — I69154 Hemiplegia and hemiparesis following nontraumatic intracerebral hemorrhage affecting left non-dominant side: Secondary | ICD-10-CM | POA: Diagnosis not present

## 2020-08-31 DIAGNOSIS — I119 Hypertensive heart disease without heart failure: Secondary | ICD-10-CM | POA: Diagnosis not present

## 2020-08-31 DIAGNOSIS — E079 Disorder of thyroid, unspecified: Secondary | ICD-10-CM | POA: Diagnosis not present

## 2020-08-31 DIAGNOSIS — E785 Hyperlipidemia, unspecified: Secondary | ICD-10-CM | POA: Diagnosis not present

## 2020-08-31 DIAGNOSIS — I080 Rheumatic disorders of both mitral and aortic valves: Secondary | ICD-10-CM | POA: Diagnosis not present

## 2020-08-31 DIAGNOSIS — S0542XD Penetrating wound of orbit with or without foreign body, left eye, subsequent encounter: Secondary | ICD-10-CM | POA: Diagnosis not present

## 2020-08-31 DIAGNOSIS — H409 Unspecified glaucoma: Secondary | ICD-10-CM | POA: Diagnosis not present

## 2020-09-01 DIAGNOSIS — I119 Hypertensive heart disease without heart failure: Secondary | ICD-10-CM | POA: Diagnosis not present

## 2020-09-01 DIAGNOSIS — E785 Hyperlipidemia, unspecified: Secondary | ICD-10-CM | POA: Diagnosis not present

## 2020-09-01 DIAGNOSIS — H5461 Unqualified visual loss, right eye, normal vision left eye: Secondary | ICD-10-CM | POA: Diagnosis not present

## 2020-09-01 DIAGNOSIS — S0542XD Penetrating wound of orbit with or without foreign body, left eye, subsequent encounter: Secondary | ICD-10-CM | POA: Diagnosis not present

## 2020-09-01 DIAGNOSIS — E119 Type 2 diabetes mellitus without complications: Secondary | ICD-10-CM | POA: Diagnosis not present

## 2020-09-01 DIAGNOSIS — I69154 Hemiplegia and hemiparesis following nontraumatic intracerebral hemorrhage affecting left non-dominant side: Secondary | ICD-10-CM | POA: Diagnosis not present

## 2020-09-01 DIAGNOSIS — E079 Disorder of thyroid, unspecified: Secondary | ICD-10-CM | POA: Diagnosis not present

## 2020-09-01 DIAGNOSIS — I080 Rheumatic disorders of both mitral and aortic valves: Secondary | ICD-10-CM | POA: Diagnosis not present

## 2020-09-01 DIAGNOSIS — H409 Unspecified glaucoma: Secondary | ICD-10-CM | POA: Diagnosis not present

## 2020-09-02 DIAGNOSIS — E079 Disorder of thyroid, unspecified: Secondary | ICD-10-CM | POA: Diagnosis not present

## 2020-09-02 DIAGNOSIS — H409 Unspecified glaucoma: Secondary | ICD-10-CM | POA: Diagnosis not present

## 2020-09-02 DIAGNOSIS — I080 Rheumatic disorders of both mitral and aortic valves: Secondary | ICD-10-CM | POA: Diagnosis not present

## 2020-09-02 DIAGNOSIS — E119 Type 2 diabetes mellitus without complications: Secondary | ICD-10-CM | POA: Diagnosis not present

## 2020-09-02 DIAGNOSIS — E785 Hyperlipidemia, unspecified: Secondary | ICD-10-CM | POA: Diagnosis not present

## 2020-09-02 DIAGNOSIS — S0542XD Penetrating wound of orbit with or without foreign body, left eye, subsequent encounter: Secondary | ICD-10-CM | POA: Diagnosis not present

## 2020-09-02 DIAGNOSIS — I69154 Hemiplegia and hemiparesis following nontraumatic intracerebral hemorrhage affecting left non-dominant side: Secondary | ICD-10-CM | POA: Diagnosis not present

## 2020-09-02 DIAGNOSIS — H5461 Unqualified visual loss, right eye, normal vision left eye: Secondary | ICD-10-CM | POA: Diagnosis not present

## 2020-09-02 DIAGNOSIS — I119 Hypertensive heart disease without heart failure: Secondary | ICD-10-CM | POA: Diagnosis not present

## 2020-09-03 DIAGNOSIS — E079 Disorder of thyroid, unspecified: Secondary | ICD-10-CM | POA: Diagnosis not present

## 2020-09-03 DIAGNOSIS — I69154 Hemiplegia and hemiparesis following nontraumatic intracerebral hemorrhage affecting left non-dominant side: Secondary | ICD-10-CM | POA: Diagnosis not present

## 2020-09-03 DIAGNOSIS — E119 Type 2 diabetes mellitus without complications: Secondary | ICD-10-CM | POA: Diagnosis not present

## 2020-09-03 DIAGNOSIS — I080 Rheumatic disorders of both mitral and aortic valves: Secondary | ICD-10-CM | POA: Diagnosis not present

## 2020-09-03 DIAGNOSIS — H5461 Unqualified visual loss, right eye, normal vision left eye: Secondary | ICD-10-CM | POA: Diagnosis not present

## 2020-09-03 DIAGNOSIS — E785 Hyperlipidemia, unspecified: Secondary | ICD-10-CM | POA: Diagnosis not present

## 2020-09-03 DIAGNOSIS — I119 Hypertensive heart disease without heart failure: Secondary | ICD-10-CM | POA: Diagnosis not present

## 2020-09-03 DIAGNOSIS — S0542XD Penetrating wound of orbit with or without foreign body, left eye, subsequent encounter: Secondary | ICD-10-CM | POA: Diagnosis not present

## 2020-09-03 DIAGNOSIS — H409 Unspecified glaucoma: Secondary | ICD-10-CM | POA: Diagnosis not present

## 2020-09-04 DIAGNOSIS — E039 Hypothyroidism, unspecified: Secondary | ICD-10-CM | POA: Diagnosis not present

## 2020-09-04 DIAGNOSIS — I69359 Hemiplegia and hemiparesis following cerebral infarction affecting unspecified side: Secondary | ICD-10-CM | POA: Diagnosis not present

## 2020-09-04 DIAGNOSIS — Z299 Encounter for prophylactic measures, unspecified: Secondary | ICD-10-CM | POA: Diagnosis not present

## 2020-09-04 DIAGNOSIS — I10 Essential (primary) hypertension: Secondary | ICD-10-CM | POA: Diagnosis not present

## 2020-09-07 DIAGNOSIS — I080 Rheumatic disorders of both mitral and aortic valves: Secondary | ICD-10-CM | POA: Diagnosis not present

## 2020-09-07 DIAGNOSIS — H5461 Unqualified visual loss, right eye, normal vision left eye: Secondary | ICD-10-CM | POA: Diagnosis not present

## 2020-09-07 DIAGNOSIS — S0542XD Penetrating wound of orbit with or without foreign body, left eye, subsequent encounter: Secondary | ICD-10-CM | POA: Diagnosis not present

## 2020-09-07 DIAGNOSIS — E785 Hyperlipidemia, unspecified: Secondary | ICD-10-CM | POA: Diagnosis not present

## 2020-09-07 DIAGNOSIS — E119 Type 2 diabetes mellitus without complications: Secondary | ICD-10-CM | POA: Diagnosis not present

## 2020-09-07 DIAGNOSIS — I119 Hypertensive heart disease without heart failure: Secondary | ICD-10-CM | POA: Diagnosis not present

## 2020-09-07 DIAGNOSIS — H409 Unspecified glaucoma: Secondary | ICD-10-CM | POA: Diagnosis not present

## 2020-09-07 DIAGNOSIS — E079 Disorder of thyroid, unspecified: Secondary | ICD-10-CM | POA: Diagnosis not present

## 2020-09-07 DIAGNOSIS — I69154 Hemiplegia and hemiparesis following nontraumatic intracerebral hemorrhage affecting left non-dominant side: Secondary | ICD-10-CM | POA: Diagnosis not present

## 2020-09-08 DIAGNOSIS — E079 Disorder of thyroid, unspecified: Secondary | ICD-10-CM | POA: Diagnosis not present

## 2020-09-08 DIAGNOSIS — I080 Rheumatic disorders of both mitral and aortic valves: Secondary | ICD-10-CM | POA: Diagnosis not present

## 2020-09-08 DIAGNOSIS — E119 Type 2 diabetes mellitus without complications: Secondary | ICD-10-CM | POA: Diagnosis not present

## 2020-09-08 DIAGNOSIS — H409 Unspecified glaucoma: Secondary | ICD-10-CM | POA: Diagnosis not present

## 2020-09-08 DIAGNOSIS — I119 Hypertensive heart disease without heart failure: Secondary | ICD-10-CM | POA: Diagnosis not present

## 2020-09-08 DIAGNOSIS — I69154 Hemiplegia and hemiparesis following nontraumatic intracerebral hemorrhage affecting left non-dominant side: Secondary | ICD-10-CM | POA: Diagnosis not present

## 2020-09-08 DIAGNOSIS — E785 Hyperlipidemia, unspecified: Secondary | ICD-10-CM | POA: Diagnosis not present

## 2020-09-08 DIAGNOSIS — S0542XD Penetrating wound of orbit with or without foreign body, left eye, subsequent encounter: Secondary | ICD-10-CM | POA: Diagnosis not present

## 2020-09-08 DIAGNOSIS — H5461 Unqualified visual loss, right eye, normal vision left eye: Secondary | ICD-10-CM | POA: Diagnosis not present

## 2020-09-09 DIAGNOSIS — E079 Disorder of thyroid, unspecified: Secondary | ICD-10-CM | POA: Diagnosis not present

## 2020-09-09 DIAGNOSIS — I119 Hypertensive heart disease without heart failure: Secondary | ICD-10-CM | POA: Diagnosis not present

## 2020-09-09 DIAGNOSIS — I69154 Hemiplegia and hemiparesis following nontraumatic intracerebral hemorrhage affecting left non-dominant side: Secondary | ICD-10-CM | POA: Diagnosis not present

## 2020-09-09 DIAGNOSIS — I080 Rheumatic disorders of both mitral and aortic valves: Secondary | ICD-10-CM | POA: Diagnosis not present

## 2020-09-09 DIAGNOSIS — E785 Hyperlipidemia, unspecified: Secondary | ICD-10-CM | POA: Diagnosis not present

## 2020-09-09 DIAGNOSIS — S0542XD Penetrating wound of orbit with or without foreign body, left eye, subsequent encounter: Secondary | ICD-10-CM | POA: Diagnosis not present

## 2020-09-09 DIAGNOSIS — E119 Type 2 diabetes mellitus without complications: Secondary | ICD-10-CM | POA: Diagnosis not present

## 2020-09-09 DIAGNOSIS — H5461 Unqualified visual loss, right eye, normal vision left eye: Secondary | ICD-10-CM | POA: Diagnosis not present

## 2020-09-09 DIAGNOSIS — H409 Unspecified glaucoma: Secondary | ICD-10-CM | POA: Diagnosis not present

## 2020-09-11 DIAGNOSIS — H5461 Unqualified visual loss, right eye, normal vision left eye: Secondary | ICD-10-CM | POA: Diagnosis not present

## 2020-09-11 DIAGNOSIS — I080 Rheumatic disorders of both mitral and aortic valves: Secondary | ICD-10-CM | POA: Diagnosis not present

## 2020-09-11 DIAGNOSIS — E785 Hyperlipidemia, unspecified: Secondary | ICD-10-CM | POA: Diagnosis not present

## 2020-09-11 DIAGNOSIS — E079 Disorder of thyroid, unspecified: Secondary | ICD-10-CM | POA: Diagnosis not present

## 2020-09-11 DIAGNOSIS — E119 Type 2 diabetes mellitus without complications: Secondary | ICD-10-CM | POA: Diagnosis not present

## 2020-09-11 DIAGNOSIS — I69154 Hemiplegia and hemiparesis following nontraumatic intracerebral hemorrhage affecting left non-dominant side: Secondary | ICD-10-CM | POA: Diagnosis not present

## 2020-09-11 DIAGNOSIS — H409 Unspecified glaucoma: Secondary | ICD-10-CM | POA: Diagnosis not present

## 2020-09-11 DIAGNOSIS — S0542XD Penetrating wound of orbit with or without foreign body, left eye, subsequent encounter: Secondary | ICD-10-CM | POA: Diagnosis not present

## 2020-09-11 DIAGNOSIS — I119 Hypertensive heart disease without heart failure: Secondary | ICD-10-CM | POA: Diagnosis not present

## 2020-09-14 DIAGNOSIS — I119 Hypertensive heart disease without heart failure: Secondary | ICD-10-CM | POA: Diagnosis not present

## 2020-09-14 DIAGNOSIS — I69154 Hemiplegia and hemiparesis following nontraumatic intracerebral hemorrhage affecting left non-dominant side: Secondary | ICD-10-CM | POA: Diagnosis not present

## 2020-09-14 DIAGNOSIS — E785 Hyperlipidemia, unspecified: Secondary | ICD-10-CM | POA: Diagnosis not present

## 2020-09-14 DIAGNOSIS — H409 Unspecified glaucoma: Secondary | ICD-10-CM | POA: Diagnosis not present

## 2020-09-14 DIAGNOSIS — I080 Rheumatic disorders of both mitral and aortic valves: Secondary | ICD-10-CM | POA: Diagnosis not present

## 2020-09-14 DIAGNOSIS — E079 Disorder of thyroid, unspecified: Secondary | ICD-10-CM | POA: Diagnosis not present

## 2020-09-14 DIAGNOSIS — S0542XD Penetrating wound of orbit with or without foreign body, left eye, subsequent encounter: Secondary | ICD-10-CM | POA: Diagnosis not present

## 2020-09-14 DIAGNOSIS — H5461 Unqualified visual loss, right eye, normal vision left eye: Secondary | ICD-10-CM | POA: Diagnosis not present

## 2020-09-14 DIAGNOSIS — E119 Type 2 diabetes mellitus without complications: Secondary | ICD-10-CM | POA: Diagnosis not present

## 2020-09-15 DIAGNOSIS — E079 Disorder of thyroid, unspecified: Secondary | ICD-10-CM | POA: Diagnosis not present

## 2020-09-15 DIAGNOSIS — S0542XD Penetrating wound of orbit with or without foreign body, left eye, subsequent encounter: Secondary | ICD-10-CM | POA: Diagnosis not present

## 2020-09-15 DIAGNOSIS — E785 Hyperlipidemia, unspecified: Secondary | ICD-10-CM | POA: Diagnosis not present

## 2020-09-15 DIAGNOSIS — I69154 Hemiplegia and hemiparesis following nontraumatic intracerebral hemorrhage affecting left non-dominant side: Secondary | ICD-10-CM | POA: Diagnosis not present

## 2020-09-15 DIAGNOSIS — I119 Hypertensive heart disease without heart failure: Secondary | ICD-10-CM | POA: Diagnosis not present

## 2020-09-15 DIAGNOSIS — E119 Type 2 diabetes mellitus without complications: Secondary | ICD-10-CM | POA: Diagnosis not present

## 2020-09-15 DIAGNOSIS — H409 Unspecified glaucoma: Secondary | ICD-10-CM | POA: Diagnosis not present

## 2020-09-15 DIAGNOSIS — H5461 Unqualified visual loss, right eye, normal vision left eye: Secondary | ICD-10-CM | POA: Diagnosis not present

## 2020-09-15 DIAGNOSIS — I080 Rheumatic disorders of both mitral and aortic valves: Secondary | ICD-10-CM | POA: Diagnosis not present

## 2020-09-16 DIAGNOSIS — S0542XD Penetrating wound of orbit with or without foreign body, left eye, subsequent encounter: Secondary | ICD-10-CM | POA: Diagnosis not present

## 2020-09-16 DIAGNOSIS — H409 Unspecified glaucoma: Secondary | ICD-10-CM | POA: Diagnosis not present

## 2020-09-16 DIAGNOSIS — E119 Type 2 diabetes mellitus without complications: Secondary | ICD-10-CM | POA: Diagnosis not present

## 2020-09-16 DIAGNOSIS — H5461 Unqualified visual loss, right eye, normal vision left eye: Secondary | ICD-10-CM | POA: Diagnosis not present

## 2020-09-16 DIAGNOSIS — I080 Rheumatic disorders of both mitral and aortic valves: Secondary | ICD-10-CM | POA: Diagnosis not present

## 2020-09-16 DIAGNOSIS — E785 Hyperlipidemia, unspecified: Secondary | ICD-10-CM | POA: Diagnosis not present

## 2020-09-16 DIAGNOSIS — I119 Hypertensive heart disease without heart failure: Secondary | ICD-10-CM | POA: Diagnosis not present

## 2020-09-16 DIAGNOSIS — I69154 Hemiplegia and hemiparesis following nontraumatic intracerebral hemorrhage affecting left non-dominant side: Secondary | ICD-10-CM | POA: Diagnosis not present

## 2020-09-16 DIAGNOSIS — E079 Disorder of thyroid, unspecified: Secondary | ICD-10-CM | POA: Diagnosis not present

## 2020-09-17 DIAGNOSIS — E079 Disorder of thyroid, unspecified: Secondary | ICD-10-CM | POA: Diagnosis not present

## 2020-09-17 DIAGNOSIS — I080 Rheumatic disorders of both mitral and aortic valves: Secondary | ICD-10-CM | POA: Diagnosis not present

## 2020-09-17 DIAGNOSIS — H5461 Unqualified visual loss, right eye, normal vision left eye: Secondary | ICD-10-CM | POA: Diagnosis not present

## 2020-09-17 DIAGNOSIS — S0542XD Penetrating wound of orbit with or without foreign body, left eye, subsequent encounter: Secondary | ICD-10-CM | POA: Diagnosis not present

## 2020-09-17 DIAGNOSIS — E119 Type 2 diabetes mellitus without complications: Secondary | ICD-10-CM | POA: Diagnosis not present

## 2020-09-17 DIAGNOSIS — I119 Hypertensive heart disease without heart failure: Secondary | ICD-10-CM | POA: Diagnosis not present

## 2020-09-17 DIAGNOSIS — H409 Unspecified glaucoma: Secondary | ICD-10-CM | POA: Diagnosis not present

## 2020-09-17 DIAGNOSIS — I69154 Hemiplegia and hemiparesis following nontraumatic intracerebral hemorrhage affecting left non-dominant side: Secondary | ICD-10-CM | POA: Diagnosis not present

## 2020-09-17 DIAGNOSIS — E785 Hyperlipidemia, unspecified: Secondary | ICD-10-CM | POA: Diagnosis not present

## 2020-09-18 DIAGNOSIS — S0542XD Penetrating wound of orbit with or without foreign body, left eye, subsequent encounter: Secondary | ICD-10-CM | POA: Diagnosis not present

## 2020-09-18 DIAGNOSIS — I119 Hypertensive heart disease without heart failure: Secondary | ICD-10-CM | POA: Diagnosis not present

## 2020-09-18 DIAGNOSIS — E119 Type 2 diabetes mellitus without complications: Secondary | ICD-10-CM | POA: Diagnosis not present

## 2020-09-18 DIAGNOSIS — I080 Rheumatic disorders of both mitral and aortic valves: Secondary | ICD-10-CM | POA: Diagnosis not present

## 2020-09-18 DIAGNOSIS — H5461 Unqualified visual loss, right eye, normal vision left eye: Secondary | ICD-10-CM | POA: Diagnosis not present

## 2020-09-18 DIAGNOSIS — I69154 Hemiplegia and hemiparesis following nontraumatic intracerebral hemorrhage affecting left non-dominant side: Secondary | ICD-10-CM | POA: Diagnosis not present

## 2020-09-18 DIAGNOSIS — H409 Unspecified glaucoma: Secondary | ICD-10-CM | POA: Diagnosis not present

## 2020-09-18 DIAGNOSIS — E079 Disorder of thyroid, unspecified: Secondary | ICD-10-CM | POA: Diagnosis not present

## 2020-09-18 DIAGNOSIS — E785 Hyperlipidemia, unspecified: Secondary | ICD-10-CM | POA: Diagnosis not present

## 2020-09-21 DIAGNOSIS — E119 Type 2 diabetes mellitus without complications: Secondary | ICD-10-CM | POA: Diagnosis not present

## 2020-09-21 DIAGNOSIS — S0542XD Penetrating wound of orbit with or without foreign body, left eye, subsequent encounter: Secondary | ICD-10-CM | POA: Diagnosis not present

## 2020-09-21 DIAGNOSIS — I119 Hypertensive heart disease without heart failure: Secondary | ICD-10-CM | POA: Diagnosis not present

## 2020-09-21 DIAGNOSIS — H409 Unspecified glaucoma: Secondary | ICD-10-CM | POA: Diagnosis not present

## 2020-09-21 DIAGNOSIS — E785 Hyperlipidemia, unspecified: Secondary | ICD-10-CM | POA: Diagnosis not present

## 2020-09-21 DIAGNOSIS — I69154 Hemiplegia and hemiparesis following nontraumatic intracerebral hemorrhage affecting left non-dominant side: Secondary | ICD-10-CM | POA: Diagnosis not present

## 2020-09-21 DIAGNOSIS — E079 Disorder of thyroid, unspecified: Secondary | ICD-10-CM | POA: Diagnosis not present

## 2020-09-21 DIAGNOSIS — H5461 Unqualified visual loss, right eye, normal vision left eye: Secondary | ICD-10-CM | POA: Diagnosis not present

## 2020-09-21 DIAGNOSIS — I080 Rheumatic disorders of both mitral and aortic valves: Secondary | ICD-10-CM | POA: Diagnosis not present

## 2020-09-23 DIAGNOSIS — I69154 Hemiplegia and hemiparesis following nontraumatic intracerebral hemorrhage affecting left non-dominant side: Secondary | ICD-10-CM | POA: Diagnosis not present

## 2020-09-23 DIAGNOSIS — H409 Unspecified glaucoma: Secondary | ICD-10-CM | POA: Diagnosis not present

## 2020-09-23 DIAGNOSIS — I080 Rheumatic disorders of both mitral and aortic valves: Secondary | ICD-10-CM | POA: Diagnosis not present

## 2020-09-23 DIAGNOSIS — S0542XD Penetrating wound of orbit with or without foreign body, left eye, subsequent encounter: Secondary | ICD-10-CM | POA: Diagnosis not present

## 2020-09-23 DIAGNOSIS — I119 Hypertensive heart disease without heart failure: Secondary | ICD-10-CM | POA: Diagnosis not present

## 2020-09-23 DIAGNOSIS — E785 Hyperlipidemia, unspecified: Secondary | ICD-10-CM | POA: Diagnosis not present

## 2020-09-23 DIAGNOSIS — E079 Disorder of thyroid, unspecified: Secondary | ICD-10-CM | POA: Diagnosis not present

## 2020-09-23 DIAGNOSIS — E119 Type 2 diabetes mellitus without complications: Secondary | ICD-10-CM | POA: Diagnosis not present

## 2020-09-23 DIAGNOSIS — H5461 Unqualified visual loss, right eye, normal vision left eye: Secondary | ICD-10-CM | POA: Diagnosis not present

## 2020-09-28 DIAGNOSIS — E119 Type 2 diabetes mellitus without complications: Secondary | ICD-10-CM | POA: Diagnosis not present

## 2020-09-28 DIAGNOSIS — I69154 Hemiplegia and hemiparesis following nontraumatic intracerebral hemorrhage affecting left non-dominant side: Secondary | ICD-10-CM | POA: Diagnosis not present

## 2020-09-28 DIAGNOSIS — I080 Rheumatic disorders of both mitral and aortic valves: Secondary | ICD-10-CM | POA: Diagnosis not present

## 2020-09-28 DIAGNOSIS — E079 Disorder of thyroid, unspecified: Secondary | ICD-10-CM | POA: Diagnosis not present

## 2020-09-28 DIAGNOSIS — H409 Unspecified glaucoma: Secondary | ICD-10-CM | POA: Diagnosis not present

## 2020-09-28 DIAGNOSIS — I119 Hypertensive heart disease without heart failure: Secondary | ICD-10-CM | POA: Diagnosis not present

## 2020-09-28 DIAGNOSIS — H5461 Unqualified visual loss, right eye, normal vision left eye: Secondary | ICD-10-CM | POA: Diagnosis not present

## 2020-09-28 DIAGNOSIS — E785 Hyperlipidemia, unspecified: Secondary | ICD-10-CM | POA: Diagnosis not present

## 2020-09-28 DIAGNOSIS — S0542XD Penetrating wound of orbit with or without foreign body, left eye, subsequent encounter: Secondary | ICD-10-CM | POA: Diagnosis not present

## 2020-10-02 DIAGNOSIS — S0542XD Penetrating wound of orbit with or without foreign body, left eye, subsequent encounter: Secondary | ICD-10-CM | POA: Diagnosis not present

## 2020-10-02 DIAGNOSIS — E079 Disorder of thyroid, unspecified: Secondary | ICD-10-CM | POA: Diagnosis not present

## 2020-10-02 DIAGNOSIS — H409 Unspecified glaucoma: Secondary | ICD-10-CM | POA: Diagnosis not present

## 2020-10-02 DIAGNOSIS — H5461 Unqualified visual loss, right eye, normal vision left eye: Secondary | ICD-10-CM | POA: Diagnosis not present

## 2020-10-02 DIAGNOSIS — I080 Rheumatic disorders of both mitral and aortic valves: Secondary | ICD-10-CM | POA: Diagnosis not present

## 2020-10-02 DIAGNOSIS — E785 Hyperlipidemia, unspecified: Secondary | ICD-10-CM | POA: Diagnosis not present

## 2020-10-02 DIAGNOSIS — I119 Hypertensive heart disease without heart failure: Secondary | ICD-10-CM | POA: Diagnosis not present

## 2020-10-02 DIAGNOSIS — E119 Type 2 diabetes mellitus without complications: Secondary | ICD-10-CM | POA: Diagnosis not present

## 2020-10-02 DIAGNOSIS — I69154 Hemiplegia and hemiparesis following nontraumatic intracerebral hemorrhage affecting left non-dominant side: Secondary | ICD-10-CM | POA: Diagnosis not present

## 2020-10-05 DIAGNOSIS — H409 Unspecified glaucoma: Secondary | ICD-10-CM | POA: Diagnosis not present

## 2020-10-05 DIAGNOSIS — I69154 Hemiplegia and hemiparesis following nontraumatic intracerebral hemorrhage affecting left non-dominant side: Secondary | ICD-10-CM | POA: Diagnosis not present

## 2020-10-05 DIAGNOSIS — I080 Rheumatic disorders of both mitral and aortic valves: Secondary | ICD-10-CM | POA: Diagnosis not present

## 2020-10-05 DIAGNOSIS — I119 Hypertensive heart disease without heart failure: Secondary | ICD-10-CM | POA: Diagnosis not present

## 2020-10-05 DIAGNOSIS — H5461 Unqualified visual loss, right eye, normal vision left eye: Secondary | ICD-10-CM | POA: Diagnosis not present

## 2020-10-05 DIAGNOSIS — S0542XD Penetrating wound of orbit with or without foreign body, left eye, subsequent encounter: Secondary | ICD-10-CM | POA: Diagnosis not present

## 2020-10-05 DIAGNOSIS — E079 Disorder of thyroid, unspecified: Secondary | ICD-10-CM | POA: Diagnosis not present

## 2020-10-05 DIAGNOSIS — E785 Hyperlipidemia, unspecified: Secondary | ICD-10-CM | POA: Diagnosis not present

## 2020-10-05 DIAGNOSIS — E119 Type 2 diabetes mellitus without complications: Secondary | ICD-10-CM | POA: Diagnosis not present

## 2020-10-06 DIAGNOSIS — E119 Type 2 diabetes mellitus without complications: Secondary | ICD-10-CM | POA: Diagnosis not present

## 2020-10-06 DIAGNOSIS — E079 Disorder of thyroid, unspecified: Secondary | ICD-10-CM | POA: Diagnosis not present

## 2020-10-06 DIAGNOSIS — S0542XD Penetrating wound of orbit with or without foreign body, left eye, subsequent encounter: Secondary | ICD-10-CM | POA: Diagnosis not present

## 2020-10-06 DIAGNOSIS — I119 Hypertensive heart disease without heart failure: Secondary | ICD-10-CM | POA: Diagnosis not present

## 2020-10-06 DIAGNOSIS — H409 Unspecified glaucoma: Secondary | ICD-10-CM | POA: Diagnosis not present

## 2020-10-06 DIAGNOSIS — H5461 Unqualified visual loss, right eye, normal vision left eye: Secondary | ICD-10-CM | POA: Diagnosis not present

## 2020-10-06 DIAGNOSIS — I69154 Hemiplegia and hemiparesis following nontraumatic intracerebral hemorrhage affecting left non-dominant side: Secondary | ICD-10-CM | POA: Diagnosis not present

## 2020-10-06 DIAGNOSIS — I080 Rheumatic disorders of both mitral and aortic valves: Secondary | ICD-10-CM | POA: Diagnosis not present

## 2020-10-06 DIAGNOSIS — E785 Hyperlipidemia, unspecified: Secondary | ICD-10-CM | POA: Diagnosis not present

## 2020-10-07 DIAGNOSIS — Z23 Encounter for immunization: Secondary | ICD-10-CM | POA: Diagnosis not present

## 2020-10-07 DIAGNOSIS — I10 Essential (primary) hypertension: Secondary | ICD-10-CM | POA: Diagnosis not present

## 2020-10-07 DIAGNOSIS — Z789 Other specified health status: Secondary | ICD-10-CM | POA: Diagnosis not present

## 2020-10-07 DIAGNOSIS — Z299 Encounter for prophylactic measures, unspecified: Secondary | ICD-10-CM | POA: Diagnosis not present

## 2020-10-14 DIAGNOSIS — I119 Hypertensive heart disease without heart failure: Secondary | ICD-10-CM | POA: Diagnosis not present

## 2020-10-14 DIAGNOSIS — I69154 Hemiplegia and hemiparesis following nontraumatic intracerebral hemorrhage affecting left non-dominant side: Secondary | ICD-10-CM | POA: Diagnosis not present

## 2020-10-14 DIAGNOSIS — H5461 Unqualified visual loss, right eye, normal vision left eye: Secondary | ICD-10-CM | POA: Diagnosis not present

## 2020-10-14 DIAGNOSIS — I080 Rheumatic disorders of both mitral and aortic valves: Secondary | ICD-10-CM | POA: Diagnosis not present

## 2020-10-14 DIAGNOSIS — E785 Hyperlipidemia, unspecified: Secondary | ICD-10-CM | POA: Diagnosis not present

## 2020-10-14 DIAGNOSIS — E119 Type 2 diabetes mellitus without complications: Secondary | ICD-10-CM | POA: Diagnosis not present

## 2020-10-14 DIAGNOSIS — E079 Disorder of thyroid, unspecified: Secondary | ICD-10-CM | POA: Diagnosis not present

## 2020-10-14 DIAGNOSIS — H409 Unspecified glaucoma: Secondary | ICD-10-CM | POA: Diagnosis not present

## 2020-10-14 DIAGNOSIS — S0542XD Penetrating wound of orbit with or without foreign body, left eye, subsequent encounter: Secondary | ICD-10-CM | POA: Diagnosis not present

## 2021-01-13 DIAGNOSIS — Z299 Encounter for prophylactic measures, unspecified: Secondary | ICD-10-CM | POA: Diagnosis not present

## 2021-01-13 DIAGNOSIS — Z789 Other specified health status: Secondary | ICD-10-CM | POA: Diagnosis not present

## 2021-01-13 DIAGNOSIS — E039 Hypothyroidism, unspecified: Secondary | ICD-10-CM | POA: Diagnosis not present

## 2021-01-13 DIAGNOSIS — I739 Peripheral vascular disease, unspecified: Secondary | ICD-10-CM | POA: Diagnosis not present

## 2021-01-13 DIAGNOSIS — Z6820 Body mass index (BMI) 20.0-20.9, adult: Secondary | ICD-10-CM | POA: Diagnosis not present

## 2021-01-13 DIAGNOSIS — I10 Essential (primary) hypertension: Secondary | ICD-10-CM | POA: Diagnosis not present

## 2021-01-13 DIAGNOSIS — I25119 Atherosclerotic heart disease of native coronary artery with unspecified angina pectoris: Secondary | ICD-10-CM | POA: Diagnosis not present

## 2021-04-12 DIAGNOSIS — Z0000 Encounter for general adult medical examination without abnormal findings: Secondary | ICD-10-CM | POA: Diagnosis not present

## 2021-04-12 DIAGNOSIS — E039 Hypothyroidism, unspecified: Secondary | ICD-10-CM | POA: Diagnosis not present

## 2021-04-12 DIAGNOSIS — Z1339 Encounter for screening examination for other mental health and behavioral disorders: Secondary | ICD-10-CM | POA: Diagnosis not present

## 2021-04-12 DIAGNOSIS — Z1331 Encounter for screening for depression: Secondary | ICD-10-CM | POA: Diagnosis not present

## 2021-04-12 DIAGNOSIS — Z79899 Other long term (current) drug therapy: Secondary | ICD-10-CM | POA: Diagnosis not present

## 2021-04-12 DIAGNOSIS — I69359 Hemiplegia and hemiparesis following cerebral infarction affecting unspecified side: Secondary | ICD-10-CM | POA: Diagnosis not present

## 2021-04-12 DIAGNOSIS — Z6821 Body mass index (BMI) 21.0-21.9, adult: Secondary | ICD-10-CM | POA: Diagnosis not present

## 2021-04-12 DIAGNOSIS — Z7189 Other specified counseling: Secondary | ICD-10-CM | POA: Diagnosis not present

## 2021-04-12 DIAGNOSIS — E7800 Pure hypercholesterolemia, unspecified: Secondary | ICD-10-CM | POA: Diagnosis not present

## 2021-04-12 DIAGNOSIS — R5383 Other fatigue: Secondary | ICD-10-CM | POA: Diagnosis not present

## 2021-04-12 DIAGNOSIS — E559 Vitamin D deficiency, unspecified: Secondary | ICD-10-CM | POA: Diagnosis not present

## 2021-04-12 DIAGNOSIS — Z299 Encounter for prophylactic measures, unspecified: Secondary | ICD-10-CM | POA: Diagnosis not present

## 2021-04-13 DIAGNOSIS — I10 Essential (primary) hypertension: Secondary | ICD-10-CM | POA: Diagnosis not present

## 2021-04-13 DIAGNOSIS — R4182 Altered mental status, unspecified: Secondary | ICD-10-CM | POA: Diagnosis not present

## 2021-04-13 DIAGNOSIS — I6602 Occlusion and stenosis of left middle cerebral artery: Secondary | ICD-10-CM | POA: Diagnosis not present

## 2021-04-13 DIAGNOSIS — E7800 Pure hypercholesterolemia, unspecified: Secondary | ICD-10-CM | POA: Diagnosis not present

## 2021-04-13 DIAGNOSIS — G309 Alzheimer's disease, unspecified: Secondary | ICD-10-CM | POA: Diagnosis not present

## 2021-04-13 DIAGNOSIS — I6611 Occlusion and stenosis of right anterior cerebral artery: Secondary | ICD-10-CM | POA: Diagnosis not present

## 2021-04-13 DIAGNOSIS — I63412 Cerebral infarction due to embolism of left middle cerebral artery: Secondary | ICD-10-CM | POA: Diagnosis not present

## 2021-04-13 DIAGNOSIS — R630 Anorexia: Secondary | ICD-10-CM | POA: Diagnosis not present

## 2021-04-13 DIAGNOSIS — I639 Cerebral infarction, unspecified: Secondary | ICD-10-CM | POA: Diagnosis not present

## 2021-04-13 DIAGNOSIS — I083 Combined rheumatic disorders of mitral, aortic and tricuspid valves: Secondary | ICD-10-CM | POA: Diagnosis not present

## 2021-04-13 DIAGNOSIS — F419 Anxiety disorder, unspecified: Secondary | ICD-10-CM | POA: Diagnosis not present

## 2021-04-13 DIAGNOSIS — F02B0 Dementia in other diseases classified elsewhere, moderate, without behavioral disturbance, psychotic disturbance, mood disturbance, and anxiety: Secondary | ICD-10-CM | POA: Diagnosis not present

## 2021-04-13 DIAGNOSIS — E440 Moderate protein-calorie malnutrition: Secondary | ICD-10-CM | POA: Diagnosis not present

## 2021-04-13 DIAGNOSIS — D649 Anemia, unspecified: Secondary | ICD-10-CM | POA: Diagnosis not present

## 2021-04-13 DIAGNOSIS — Z736 Limitation of activities due to disability: Secondary | ICD-10-CM | POA: Diagnosis not present

## 2021-04-13 DIAGNOSIS — I6523 Occlusion and stenosis of bilateral carotid arteries: Secondary | ICD-10-CM | POA: Diagnosis not present

## 2021-04-13 DIAGNOSIS — I63512 Cerebral infarction due to unspecified occlusion or stenosis of left middle cerebral artery: Secondary | ICD-10-CM | POA: Diagnosis not present

## 2021-04-13 DIAGNOSIS — E042 Nontoxic multinodular goiter: Secondary | ICD-10-CM | POA: Diagnosis not present

## 2021-04-13 DIAGNOSIS — Z7189 Other specified counseling: Secondary | ICD-10-CM | POA: Diagnosis not present

## 2021-04-13 DIAGNOSIS — G459 Transient cerebral ischemic attack, unspecified: Secondary | ICD-10-CM | POA: Diagnosis not present

## 2021-04-13 DIAGNOSIS — R110 Nausea: Secondary | ICD-10-CM | POA: Diagnosis not present

## 2021-04-13 DIAGNOSIS — Z66 Do not resuscitate: Secondary | ICD-10-CM | POA: Diagnosis not present

## 2021-04-13 DIAGNOSIS — E639 Nutritional deficiency, unspecified: Secondary | ICD-10-CM | POA: Diagnosis not present

## 2021-04-13 DIAGNOSIS — I63232 Cerebral infarction due to unspecified occlusion or stenosis of left carotid arteries: Secondary | ICD-10-CM | POA: Diagnosis not present

## 2021-04-13 DIAGNOSIS — G301 Alzheimer's disease with late onset: Secondary | ICD-10-CM | POA: Diagnosis not present

## 2021-04-13 DIAGNOSIS — Z7409 Other reduced mobility: Secondary | ICD-10-CM | POA: Diagnosis not present

## 2021-04-13 DIAGNOSIS — R532 Functional quadriplegia: Secondary | ICD-10-CM | POA: Diagnosis not present

## 2021-04-13 DIAGNOSIS — R6889 Other general symptoms and signs: Secondary | ICD-10-CM | POA: Diagnosis not present

## 2021-04-13 DIAGNOSIS — R29702 NIHSS score 2: Secondary | ICD-10-CM | POA: Diagnosis not present

## 2021-04-13 DIAGNOSIS — R4701 Aphasia: Secondary | ICD-10-CM | POA: Diagnosis not present

## 2021-04-13 DIAGNOSIS — I69328 Other speech and language deficits following cerebral infarction: Secondary | ICD-10-CM | POA: Diagnosis not present

## 2021-04-13 DIAGNOSIS — I6389 Other cerebral infarction: Secondary | ICD-10-CM | POA: Diagnosis not present

## 2021-04-13 DIAGNOSIS — M7989 Other specified soft tissue disorders: Secondary | ICD-10-CM | POA: Diagnosis not present

## 2021-04-13 DIAGNOSIS — Z20822 Contact with and (suspected) exposure to covid-19: Secondary | ICD-10-CM | POA: Diagnosis not present

## 2021-04-13 DIAGNOSIS — G8194 Hemiplegia, unspecified affecting left nondominant side: Secondary | ICD-10-CM | POA: Diagnosis not present

## 2021-04-13 DIAGNOSIS — Z515 Encounter for palliative care: Secondary | ICD-10-CM | POA: Diagnosis not present

## 2021-04-13 DIAGNOSIS — R451 Restlessness and agitation: Secondary | ICD-10-CM | POA: Diagnosis not present

## 2021-04-13 DIAGNOSIS — F0390 Unspecified dementia without behavioral disturbance: Secondary | ICD-10-CM | POA: Diagnosis not present

## 2021-04-13 DIAGNOSIS — R52 Pain, unspecified: Secondary | ICD-10-CM | POA: Diagnosis not present

## 2021-04-13 DIAGNOSIS — R531 Weakness: Secondary | ICD-10-CM | POA: Diagnosis not present

## 2021-04-13 DIAGNOSIS — Z681 Body mass index (BMI) 19 or less, adult: Secondary | ICD-10-CM | POA: Diagnosis not present

## 2021-04-13 DIAGNOSIS — R1310 Dysphagia, unspecified: Secondary | ICD-10-CM | POA: Diagnosis not present

## 2021-04-13 DIAGNOSIS — I679 Cerebrovascular disease, unspecified: Secondary | ICD-10-CM | POA: Diagnosis not present

## 2021-05-10 DEATH — deceased

## 2023-01-08 IMAGING — MR MR HEAD W/O CM
10 of 11 series · 43 of 48 positions shown · non-contrast
Comparison: No pertinent prior exam.

CLINICAL DATA: Headache.  Intracranial hemorrhage.

EXAM:
MRI HEAD WITHOUT CONTRAST
MRA HEAD WITHOUT CONTRAST
TECHNIQUE: Multiplanar, multi-echo pulse sequences of the brain and surrounding
structures were acquired without intravenous contrast. Angiographic
images of the Circle of Willis were acquired using MRA technique
without intravenous contrast.

[Series 5: DWI · axial · 3.0mm · 0.88mm/px · z∈[-22,+124]mm · 9 of 100 slices shown (1 of 4)]
[im 1/100]
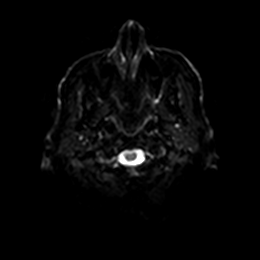
[im 13/100]
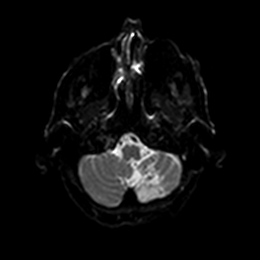
[im 25/100]
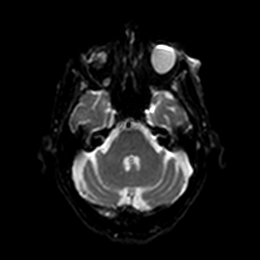
[im 38/100]
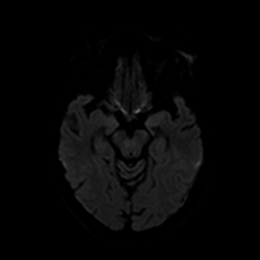
[im 50/100]
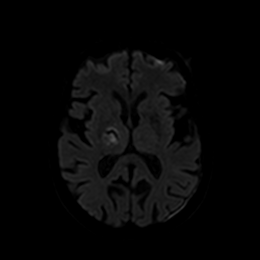
[im 62/100]
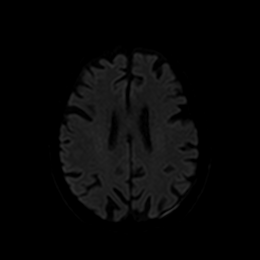
[im 75/100]
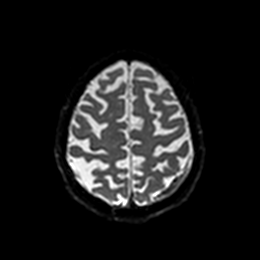
[im 87/100]
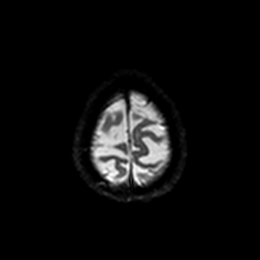
[im 100/100]
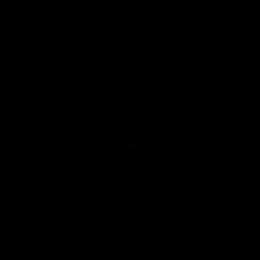

[Series 6: DWI · axial · 3.0mm · 0.88mm/px · z∈[-22,+124]mm · 5 of 50 slices shown (2 of 4)]
[im 1/50]
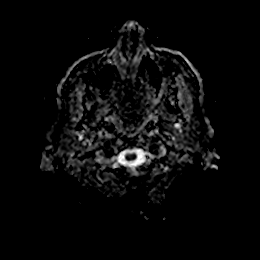
[im 13/50]
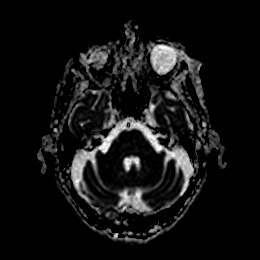
[im 25/50]
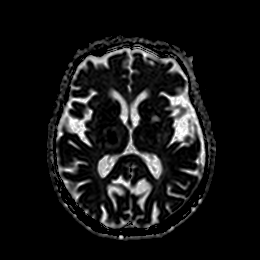
[im 37/50]
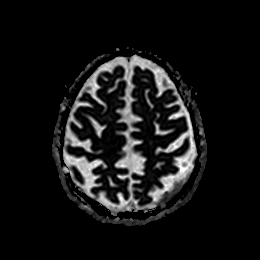
[im 50/50]
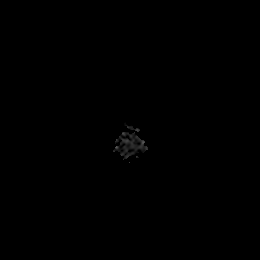

[Series 7: DWI · coronal · 4.0mm · 0.88mm/px · 7 of 70 slices shown (3 of 4)]
[im 1/70]
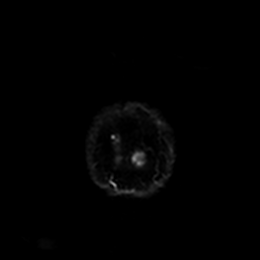
[im 12/70]
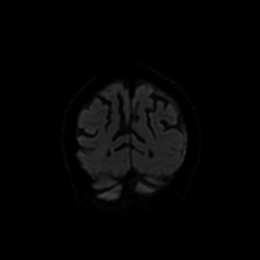
[im 24/70]
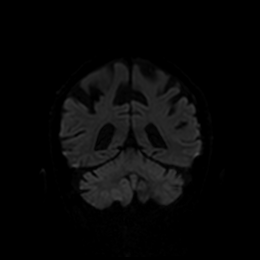
[im 35/70]
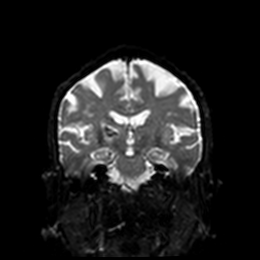
[im 47/70]
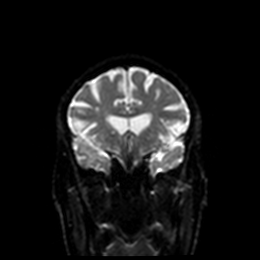
[im 58/70]
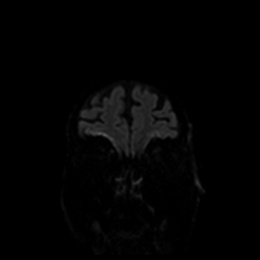
[im 70/70]
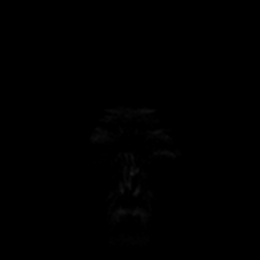

[Series 8: DWI · coronal · 4.0mm · 0.88mm/px · 3 of 35 slices shown (4 of 4)]
[im 1/35]
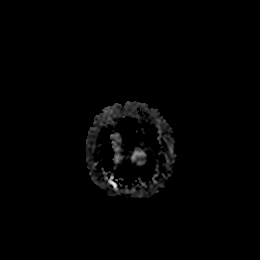
[im 18/35]
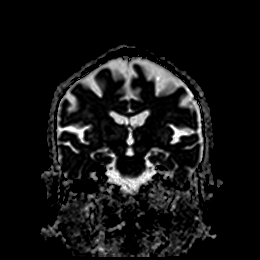
[im 35/35]
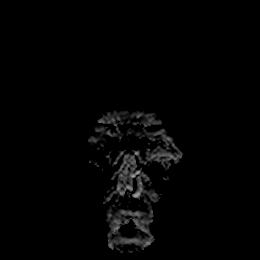

[Series 9: T1 · sagittal · 5.0mm · 0.75mm/px · 2 of 25 slices shown]
[im 1/25]
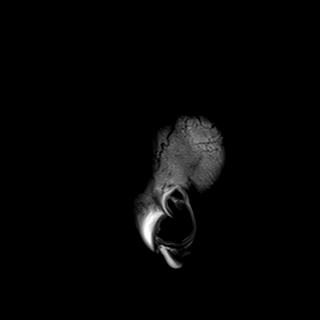
[im 25/25]
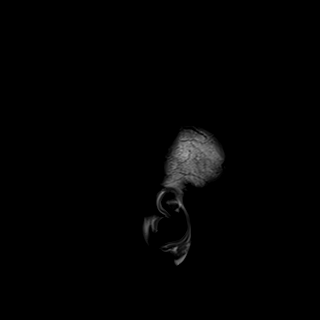

[Series 10: T2 · axial · 5.0mm · 0.72mm/px · z∈[-20,+122]mm · 2 of 25 slices shown (1 of 2)]
[im 1/25]
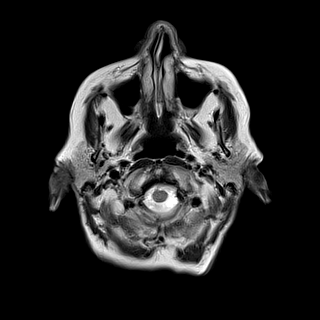
[im 25/25]
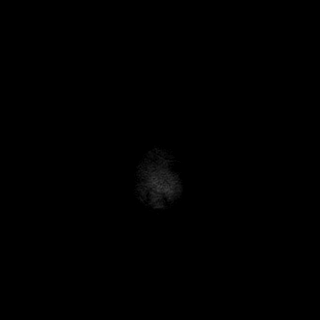

[Series 11: FLAIR · axial · 5.0mm · 0.45mm/px · z∈[-19,+123]mm · 2 of 25 slices shown]
[im 1/25]
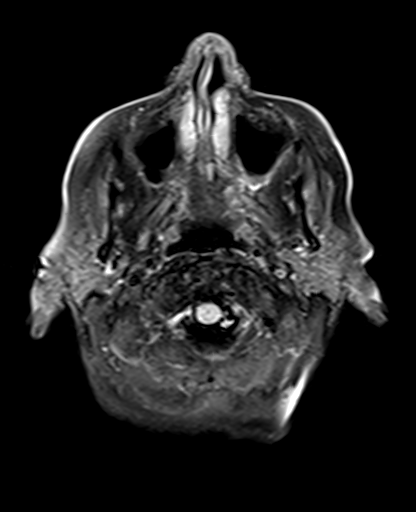
[im 25/25]
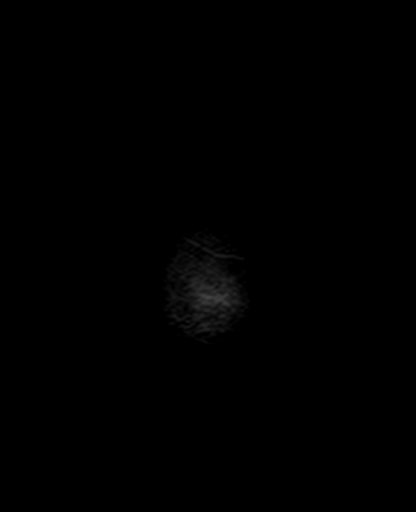

[Series 13: pha_images · axial · 3.0mm · 0.90mm/px · z∈[-24,+128]mm · 5 of 52 slices shown]
[im 1/52]
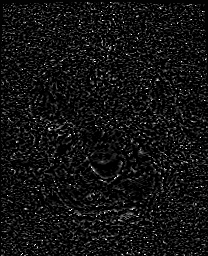
[im 13/52]
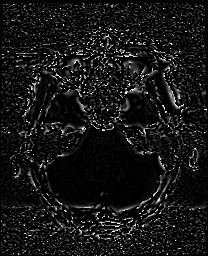
[im 26/52]
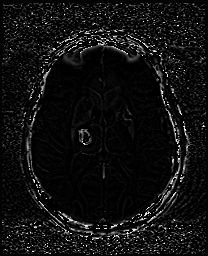
[im 39/52]
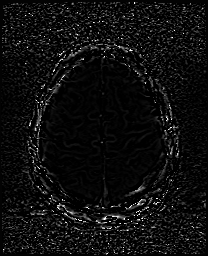
[im 52/52]
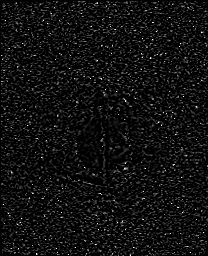

[Series 14: swi_images · axial · 3.0mm · 0.90mm/px · z∈[-24,+128]mm · 5 of 52 slices shown]
[im 1/52]
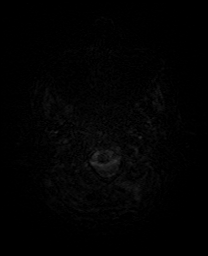
[im 13/52]
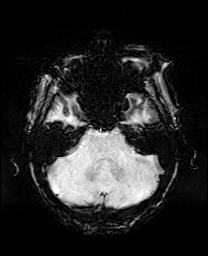
[im 26/52]
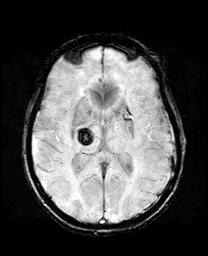
[im 39/52]
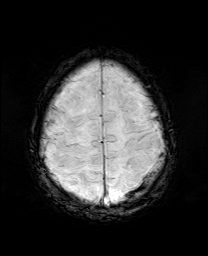
[im 52/52]
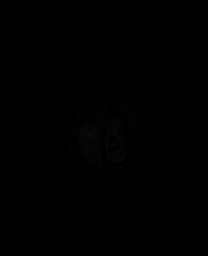

[Series 17: T2 · coronal · 5.0mm · 0.34mm/px · 3 of 30 slices shown (2 of 2)]
[im 1/30]
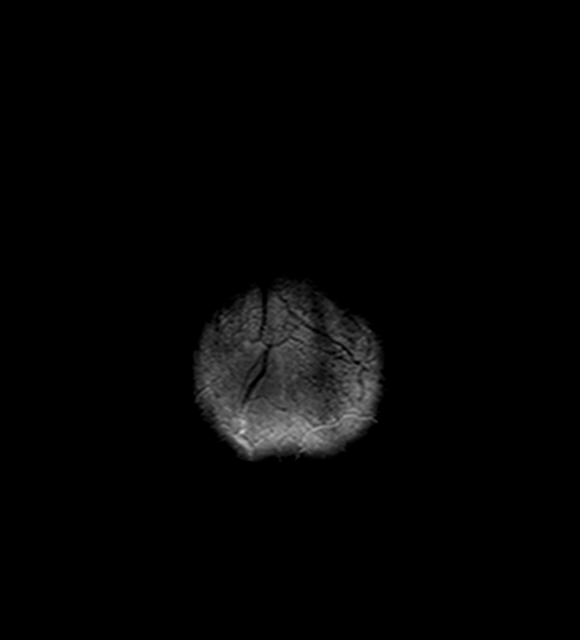
[im 15/30]
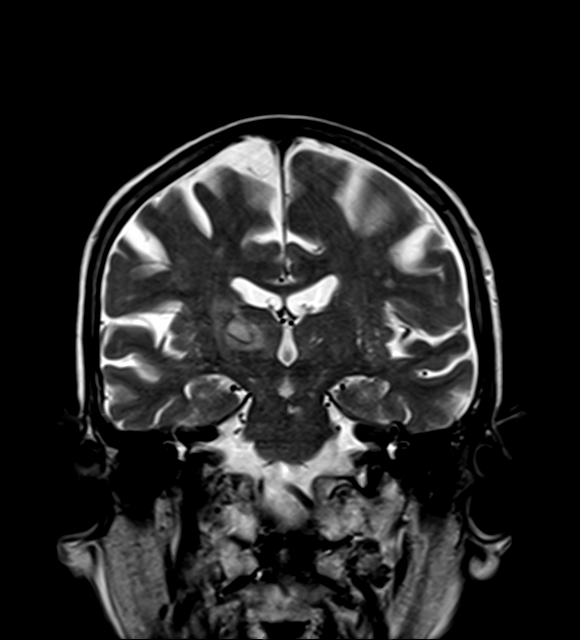
[im 30/30]
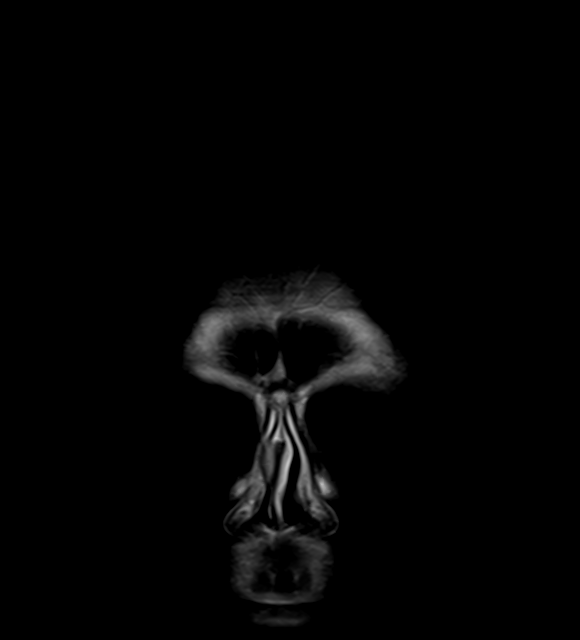

[43 of 48 positions shown; findings below may reference images not displayed]

FINDINGS: MRI HEAD FINDINGS

Brain: Unchanged size of intraparenchymal hematoma of the right
thalamus and internal capsule. Small amount of subdural blood at the
posterior left convexity. No midline shift or other mass effect.
Small amount of right frontal subarachnoid blood, unchanged. Old
left basal ganglia small vessel infarct. There is multifocal
hyperintense T2-weighted signal within the white matter. Generalized
volume loss without a clear lobar predilection. The midline
structures are normal.

Vascular: Major flow voids are preserved.

Skull and upper cervical spine: Normal calvarium and skull base.
Visualized upper cervical spine and soft tissues are normal.

Sinuses/Orbits:No paranasal sinus fluid levels or advanced mucosal
thickening. No mastoid or middle ear effusion. Right phthisis bulbi.

MRA HEAD FINDINGS

POSTERIOR CIRCULATION:

--Vertebral arteries: Normal

--Inferior cerebellar arteries: Normal.

--Basilar artery: Normal.

--Superior cerebellar arteries: Normal.

--Posterior cerebral arteries: Narrowing of the right P1-2 junction.
Otherwise normal.

ANTERIOR CIRCULATION:

--Intracranial internal carotid arteries: Normal.

--Anterior cerebral arteries (ACA): Normal.

--Middle cerebral arteries (MCA): Normal.

ANATOMIC VARIANTS: None
IMPRESSION: 1. Unchanged size of intraparenchymal hematoma of the right thalamus
and internal capsule.
2. Small amount of right frontal subarachnoid and left posterior
convexity subdural hemorrhage.
3. No emergent large vessel occlusion or high-grade stenosis.
4. Narrowing of the right PCA P1-2 junction.

## 2023-01-08 IMAGING — MR MR MRA HEAD W/O CM
1 series · 24 of 48 positions shown · non-contrast
Comparison: No pertinent prior exam.

CLINICAL DATA: Headache.  Intracranial hemorrhage.

EXAM:
MRI HEAD WITHOUT CONTRAST
MRA HEAD WITHOUT CONTRAST
TECHNIQUE: Multiplanar, multi-echo pulse sequences of the brain and surrounding
structures were acquired without intravenous contrast. Angiographic
images of the Circle of Willis were acquired using MRA technique
without intravenous contrast.

[Series 5: 3d cow · axial · 0.5mm · 0.41mm/px · z∈[-27,+62]mm · 24 of 188 slices shown]
[im 1/188]
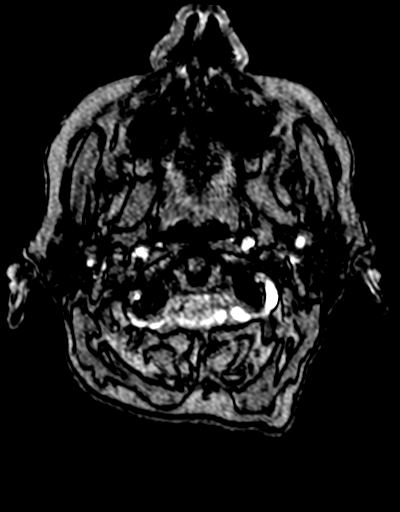
[im 4/188]
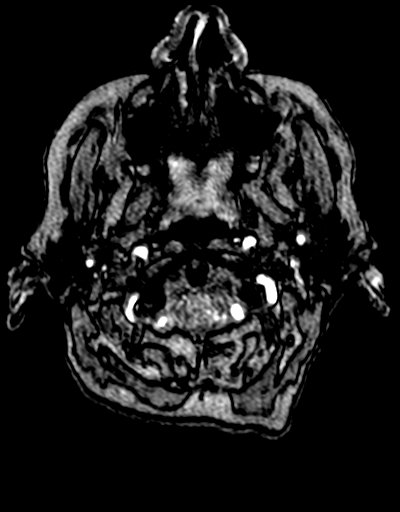
[im 8/188]
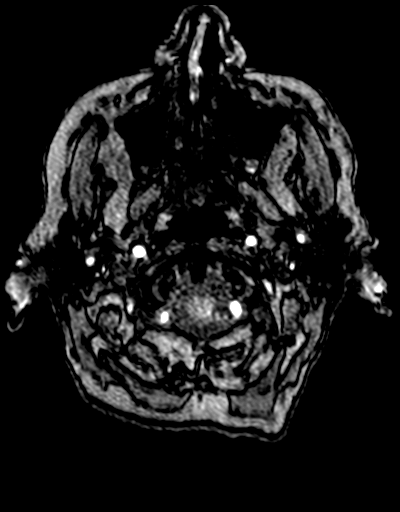
[im 12/188]
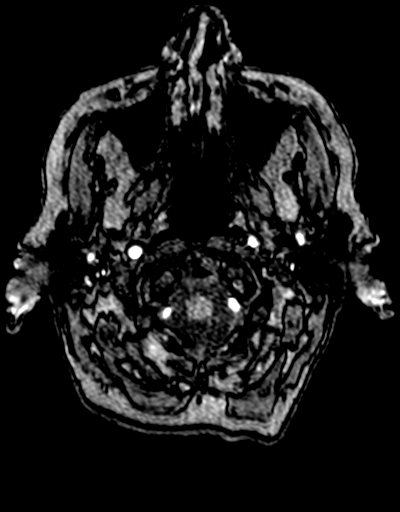
[im 16/188]
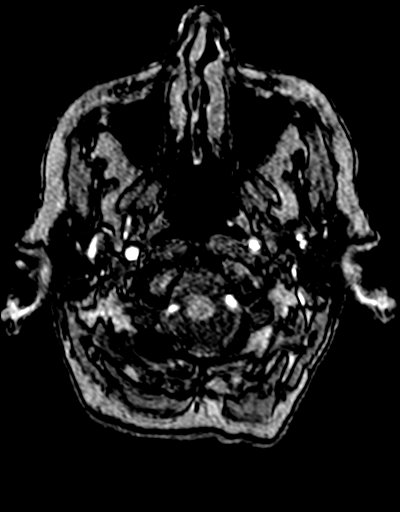
[im 20/188]
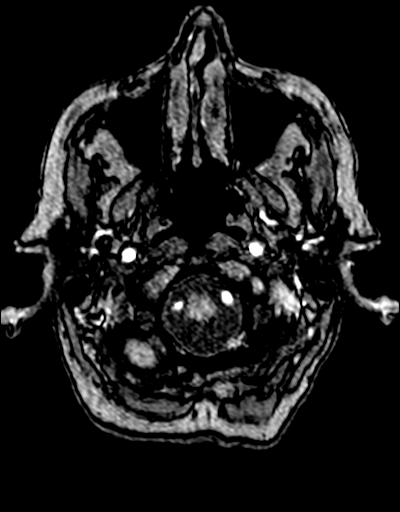
[im 24/188]
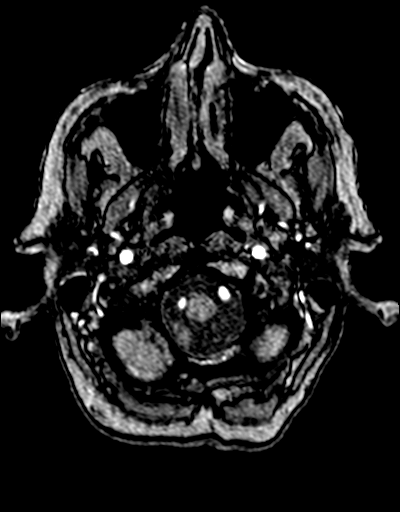
[im 28/188]
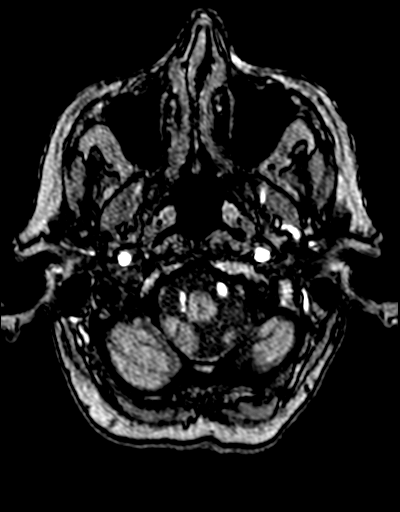
[im 32/188]
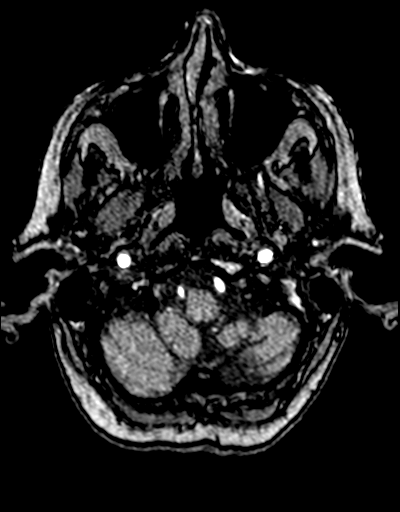
[im 36/188]
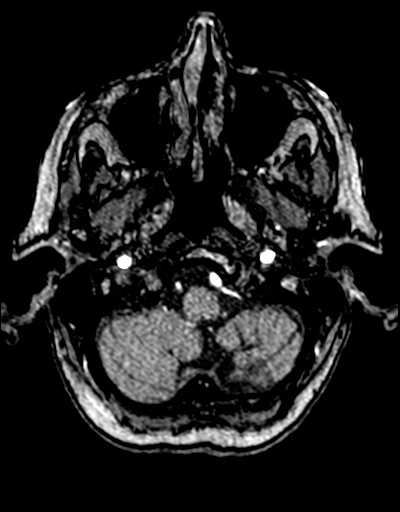
[im 40/188]
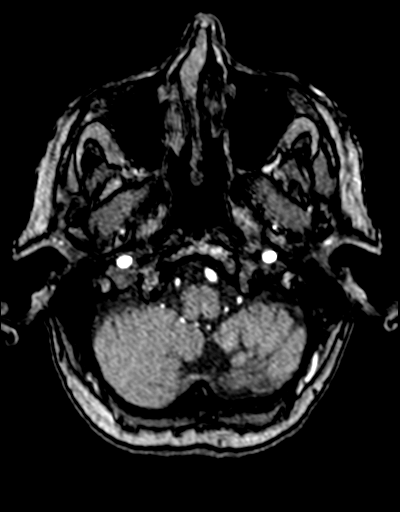
[im 44/188]
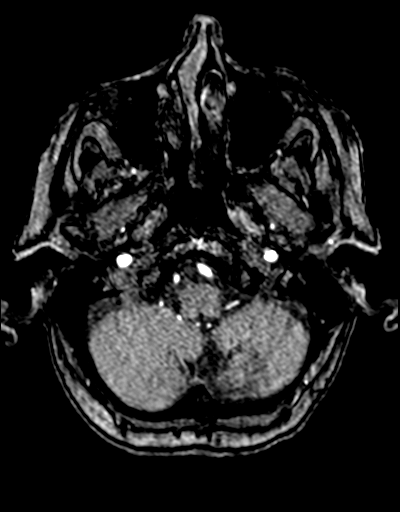
[im 48/188]
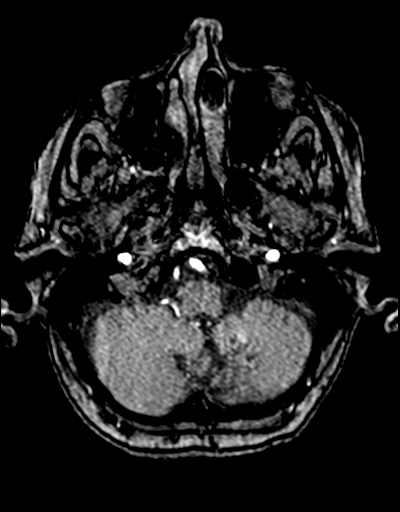
[im 52/188]
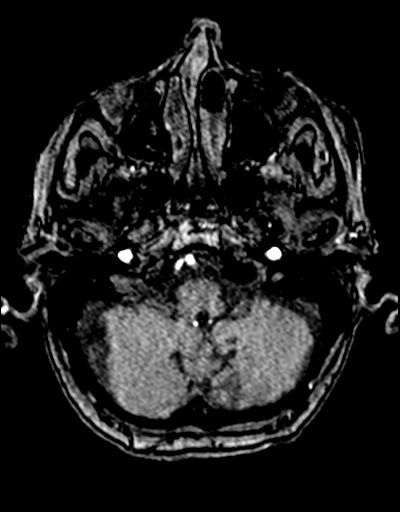
[im 56/188]
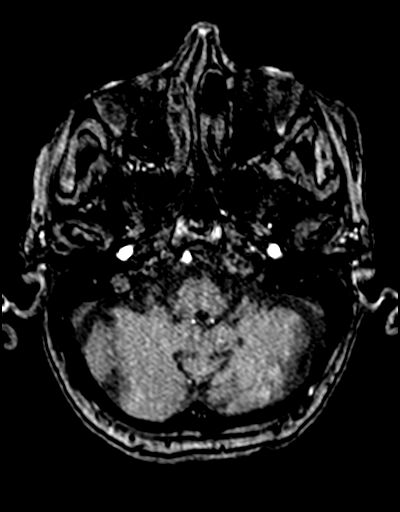
[im 60/188]
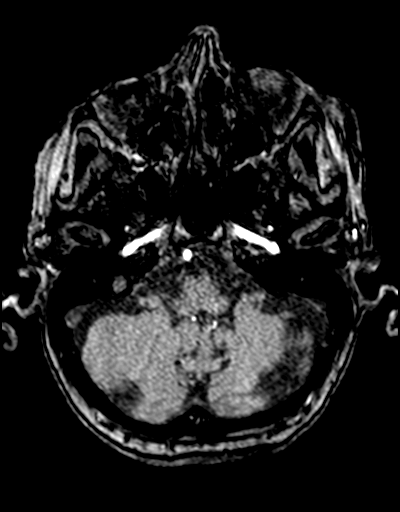
[im 64/188]
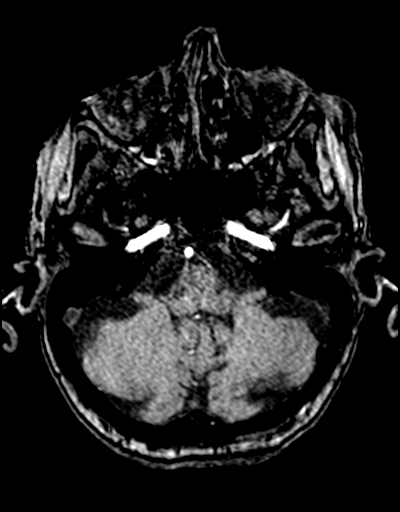
[im 84/188]
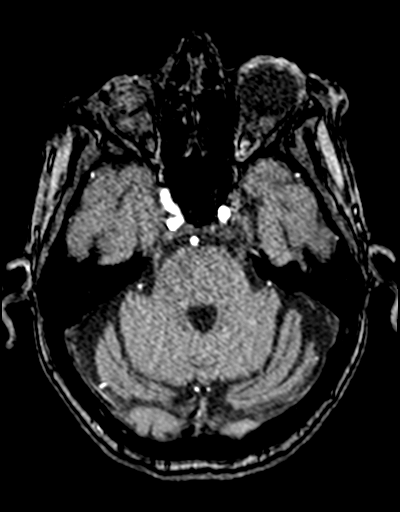
[im 96/188]
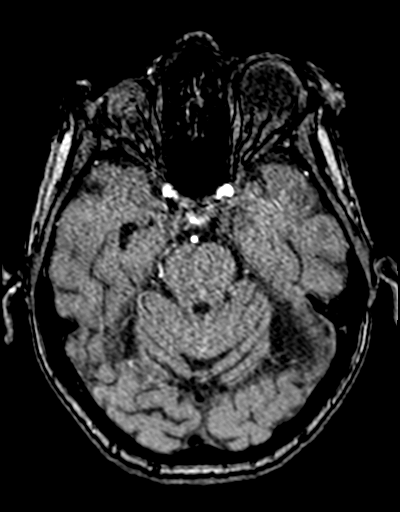
[im 108/188]
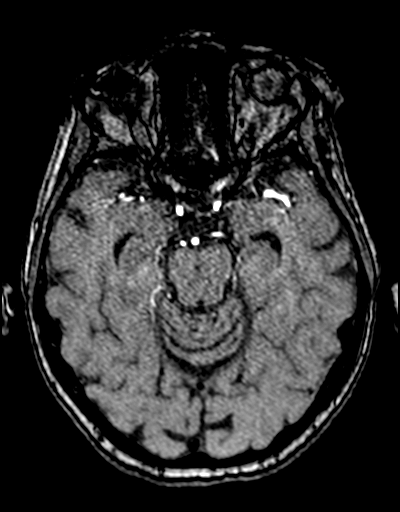
[im 132/188]
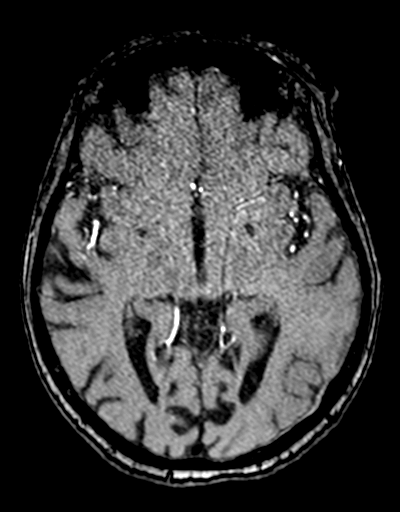
[im 156/188]
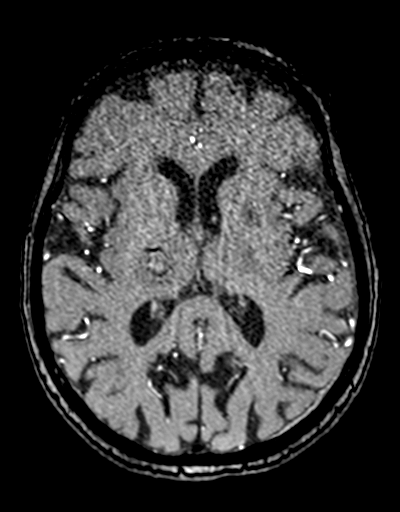
[im 160/188]
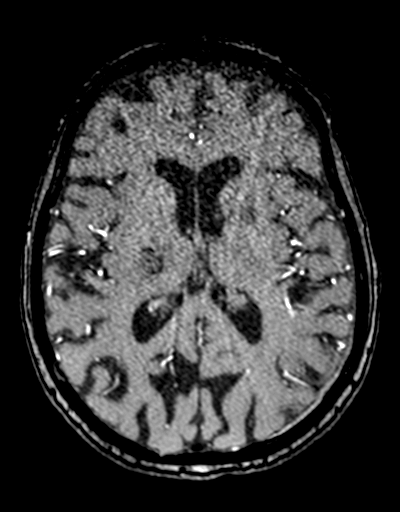
[im 180/188]
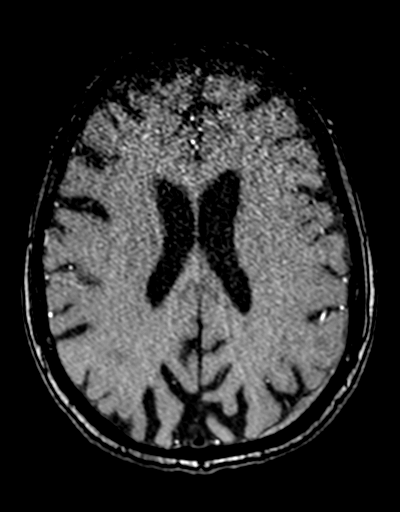

[24 of 48 positions shown; findings below may reference images not displayed]

FINDINGS: MRI HEAD FINDINGS

Brain: Unchanged size of intraparenchymal hematoma of the right
thalamus and internal capsule. Small amount of subdural blood at the
posterior left convexity. No midline shift or other mass effect.
Small amount of right frontal subarachnoid blood, unchanged. Old
left basal ganglia small vessel infarct. There is multifocal
hyperintense T2-weighted signal within the white matter. Generalized
volume loss without a clear lobar predilection. The midline
structures are normal.

Vascular: Major flow voids are preserved.

Skull and upper cervical spine: Normal calvarium and skull base.
Visualized upper cervical spine and soft tissues are normal.

Sinuses/Orbits:No paranasal sinus fluid levels or advanced mucosal
thickening. No mastoid or middle ear effusion. Right phthisis bulbi.

MRA HEAD FINDINGS

POSTERIOR CIRCULATION:

--Vertebral arteries: Normal

--Inferior cerebellar arteries: Normal.

--Basilar artery: Normal.

--Superior cerebellar arteries: Normal.

--Posterior cerebral arteries: Narrowing of the right P1-2 junction.
Otherwise normal.

ANTERIOR CIRCULATION:

--Intracranial internal carotid arteries: Normal.

--Anterior cerebral arteries (ACA): Normal.

--Middle cerebral arteries (MCA): Normal.

ANATOMIC VARIANTS: None
IMPRESSION: 1. Unchanged size of intraparenchymal hematoma of the right thalamus
and internal capsule.
2. Small amount of right frontal subarachnoid and left posterior
convexity subdural hemorrhage.
3. No emergent large vessel occlusion or high-grade stenosis.
4. Narrowing of the right PCA P1-2 junction.
